# Patient Record
Sex: Male | Born: 1955 | Race: White | Hispanic: No | Marital: Married | Smoking: Never smoker
Health system: Southern US, Community
[De-identification: ages and names within clinical notes are randomized; demographics above are authoritative.]

## PROBLEM LIST (undated history)

## (undated) DIAGNOSIS — E785 Hyperlipidemia, unspecified: Secondary | ICD-10-CM

## (undated) DIAGNOSIS — T7840XA Allergy, unspecified, initial encounter: Secondary | ICD-10-CM

## (undated) DIAGNOSIS — K219 Gastro-esophageal reflux disease without esophagitis: Secondary | ICD-10-CM

## (undated) DIAGNOSIS — C61 Malignant neoplasm of prostate: Secondary | ICD-10-CM

## (undated) DIAGNOSIS — H269 Unspecified cataract: Secondary | ICD-10-CM

## (undated) DIAGNOSIS — I10 Essential (primary) hypertension: Secondary | ICD-10-CM

## (undated) DIAGNOSIS — C4491 Basal cell carcinoma of skin, unspecified: Secondary | ICD-10-CM

## (undated) HISTORY — PX: COLONOSCOPY: SHX174

## (undated) HISTORY — DX: Malignant neoplasm of prostate: C61

## (undated) HISTORY — DX: Essential (primary) hypertension: I10

## (undated) HISTORY — DX: Unspecified cataract: H26.9

## (undated) HISTORY — DX: Allergy, unspecified, initial encounter: T78.40XA

## (undated) HISTORY — DX: Hyperlipidemia, unspecified: E78.5

## (undated) HISTORY — DX: Basal cell carcinoma of skin, unspecified: C44.91

## (undated) HISTORY — DX: Gastro-esophageal reflux disease without esophagitis: K21.9

---

## 2016-04-03 DIAGNOSIS — Z0000 Encounter for general adult medical examination without abnormal findings: Secondary | ICD-10-CM | POA: Diagnosis not present

## 2016-09-18 DIAGNOSIS — L814 Other melanin hyperpigmentation: Secondary | ICD-10-CM | POA: Diagnosis not present

## 2016-09-18 DIAGNOSIS — D229 Melanocytic nevi, unspecified: Secondary | ICD-10-CM | POA: Diagnosis not present

## 2016-09-18 DIAGNOSIS — L821 Other seborrheic keratosis: Secondary | ICD-10-CM | POA: Diagnosis not present

## 2017-01-20 DIAGNOSIS — L820 Inflamed seborrheic keratosis: Secondary | ICD-10-CM | POA: Diagnosis not present

## 2017-01-20 DIAGNOSIS — D225 Melanocytic nevi of trunk: Secondary | ICD-10-CM | POA: Diagnosis not present

## 2017-01-20 DIAGNOSIS — L814 Other melanin hyperpigmentation: Secondary | ICD-10-CM | POA: Diagnosis not present

## 2017-01-20 DIAGNOSIS — L218 Other seborrheic dermatitis: Secondary | ICD-10-CM | POA: Diagnosis not present

## 2017-01-20 DIAGNOSIS — L570 Actinic keratosis: Secondary | ICD-10-CM | POA: Diagnosis not present

## 2017-04-08 ENCOUNTER — Ambulatory Visit (INDEPENDENT_AMBULATORY_CARE_PROVIDER_SITE_OTHER): Payer: BLUE CROSS/BLUE SHIELD | Admitting: Family Medicine

## 2017-04-08 ENCOUNTER — Encounter: Payer: Self-pay | Admitting: Family Medicine

## 2017-04-08 ENCOUNTER — Ambulatory Visit (INDEPENDENT_AMBULATORY_CARE_PROVIDER_SITE_OTHER): Payer: BLUE CROSS/BLUE SHIELD

## 2017-04-08 VITALS — BP 110/71 | HR 65 | Temp 98.7°F | Ht 70.0 in | Wt 189.0 lb

## 2017-04-08 DIAGNOSIS — Z0000 Encounter for general adult medical examination without abnormal findings: Secondary | ICD-10-CM | POA: Diagnosis not present

## 2017-04-08 DIAGNOSIS — Z125 Encounter for screening for malignant neoplasm of prostate: Secondary | ICD-10-CM

## 2017-04-08 DIAGNOSIS — Z23 Encounter for immunization: Secondary | ICD-10-CM

## 2017-04-08 DIAGNOSIS — M25562 Pain in left knee: Secondary | ICD-10-CM | POA: Diagnosis not present

## 2017-04-08 DIAGNOSIS — E782 Mixed hyperlipidemia: Secondary | ICD-10-CM | POA: Diagnosis not present

## 2017-04-08 DIAGNOSIS — E785 Hyperlipidemia, unspecified: Secondary | ICD-10-CM | POA: Insufficient documentation

## 2017-04-08 DIAGNOSIS — I10 Essential (primary) hypertension: Secondary | ICD-10-CM

## 2017-04-08 IMAGING — DX DG KNEE 1-2V*L*
2 series · 2 of 2 positions shown · non-contrast
Comparison: None

CLINICAL DATA: LEFT knee pain

EXAM:
LEFT KNEE - 1-2 VIEW

[knee ap]
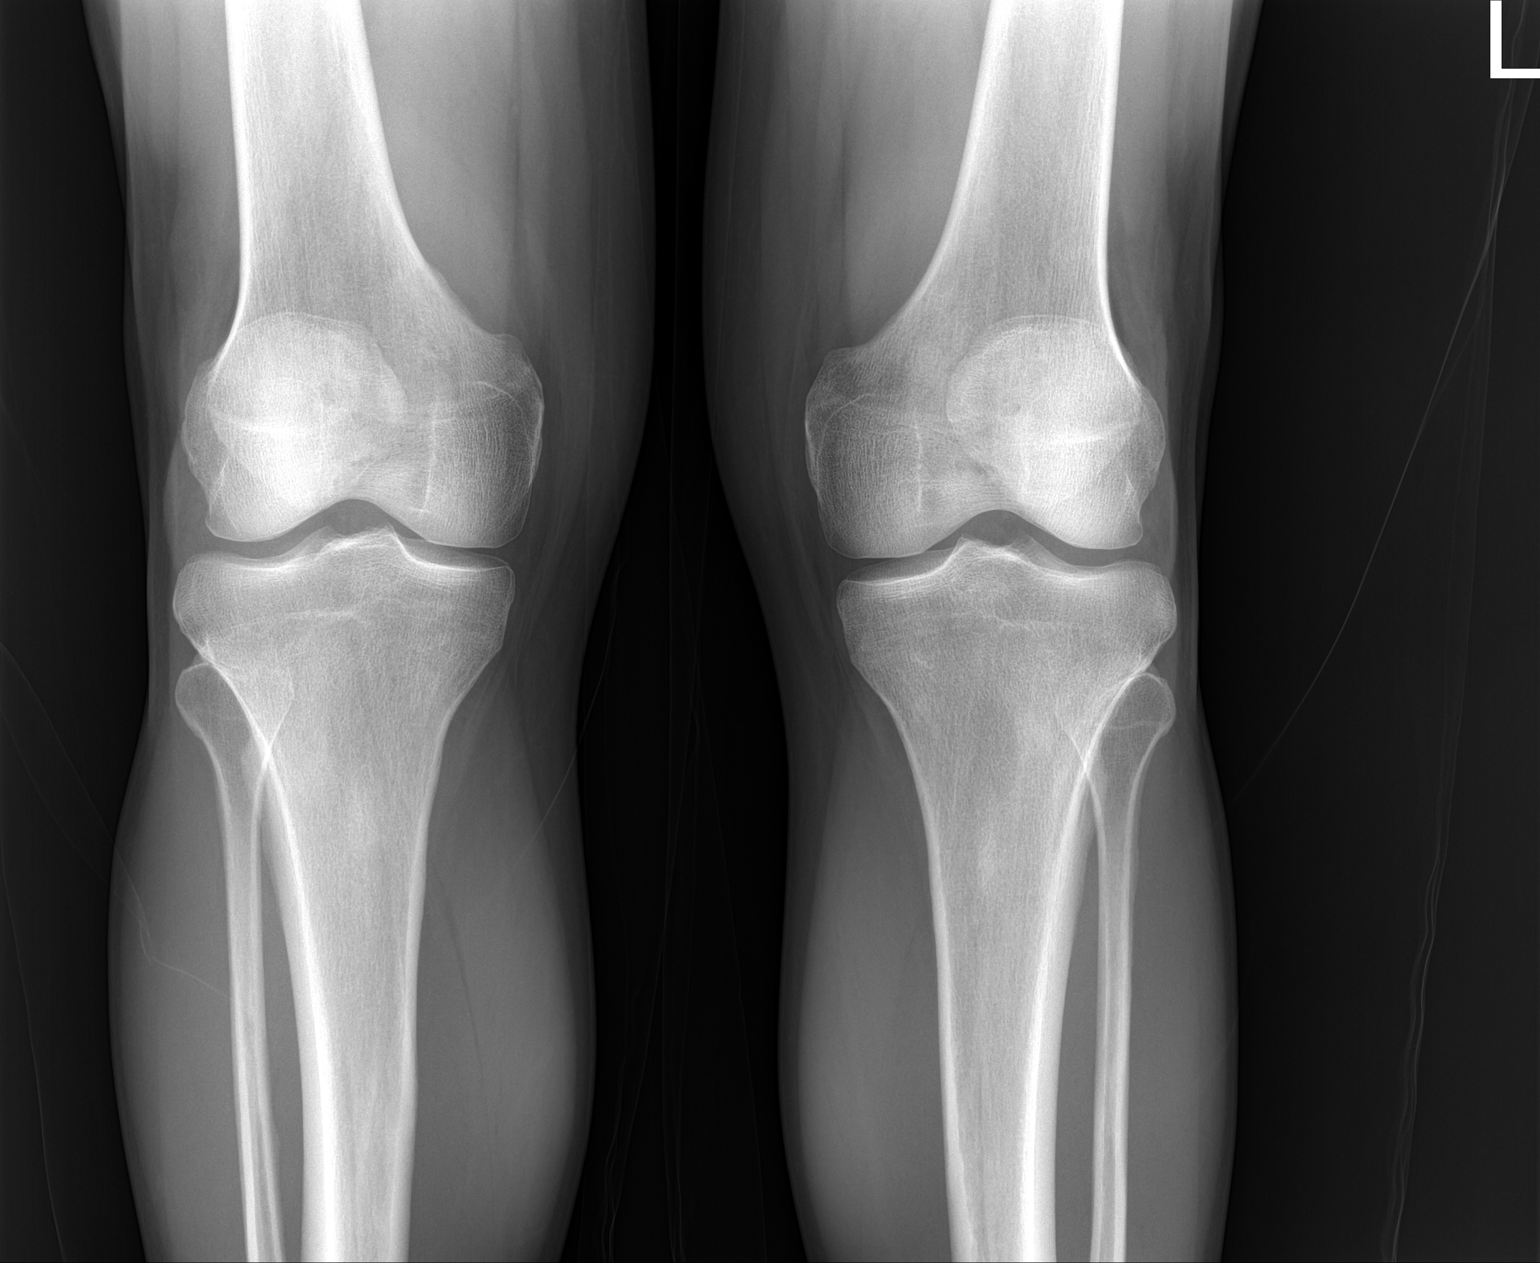

[knee lat]
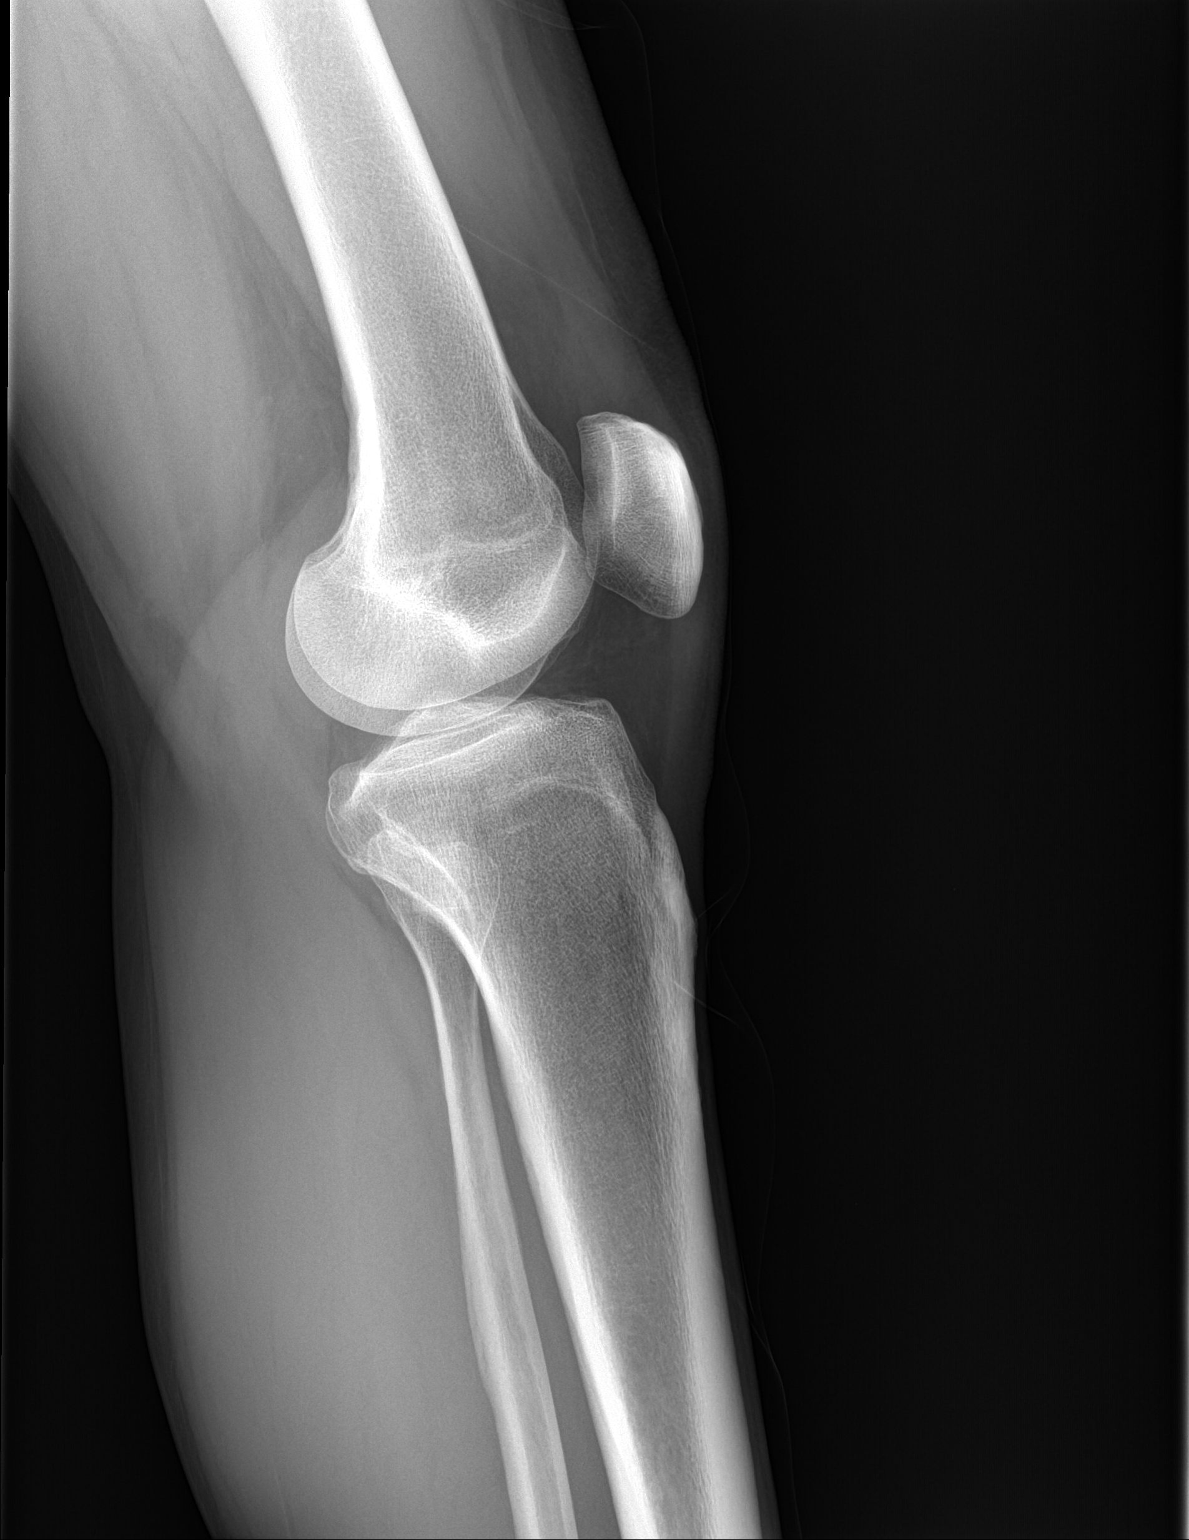

[2 of 2 positions shown; findings below may reference images not displayed]

FINDINGS: Osseous mineralization normal.

Joint spaces preserved.

No acute fracture, dislocation, or bone destruction.

No knee joint effusion.

Comparison AP view of the RIGHT knee is unremarkable.
IMPRESSION: Normal exam.

## 2017-04-08 NOTE — Progress Notes (Signed)
Subjective:  Patient ID: Edward Taylor, male    DOB: 1955/05/16  Age: 62 y.o. MRN: [id number]  CC: New Patient (Initial Visit) (pt here today to establish care and CPE)   HPI Edward Taylor presents for annual CPE.   What he is having pain in the left knee frequently.  Patient in for follow-up of elevated cholesterol. Doing well without complaints on current medication. Denies side effects of statin including myalgia and arthralgia and nausea. Also in today for liver function testing. Currently no chest pain, shortness of breath or other cardiovascular related symptoms noted.   follow-up of hypertension. Patient has no history of headache chest pain or shortness of breath or recent cough. Patient also denies symptoms of TIA such as numbness weakness lateralizing. Patient checks  blood pressure at home and has not had any elevated readings recently. Patient denies side effects from his medication. States taking it regularly.  Depression screen PHQ 2/9 04/08/2017  Decreased Interest 0  Down, Depressed, Hopeless 0  PHQ - 2 Score 0    History Kire has a past medical history of Allergy, GERD (gastroesophageal reflux disease), Hyperlipidemia, and Hypertension.   He has no past surgical history on file.   His family history includes Arthritis in his father; Cancer in his mother; Diabetes in his father; Heart disease in his maternal grandfather and paternal grandfather; Hyperlipidemia in his father; Hypertension in his father and mother; Stroke in his father.He reports that  has never smoked. he has never used smokeless tobacco. He reports that he drinks about 2.4 oz of alcohol per week. He reports that he does not use drugs.    ROS Review of Systems  Constitutional: Negative for chills, diaphoresis, fever and unexpected weight change.  HENT: Negative for congestion, hearing loss, rhinorrhea and sore throat.   Eyes: Negative for visual disturbance.  Respiratory: Negative for cough  and shortness of breath.   Cardiovascular: Negative for chest pain.  Gastrointestinal: Negative for abdominal pain, constipation and diarrhea.  Genitourinary: Negative for dysuria and flank pain.  Musculoskeletal: Negative for arthralgias and joint swelling.  Skin: Negative for rash.  Neurological: Negative for dizziness and headaches.  Psychiatric/Behavioral: Negative for dysphoric mood and sleep disturbance.    Objective:  BP 110/71   Pulse 65   Temp 98.7 F (37.1 C) (Oral)   Ht _0  (1.778 m)   Wt 189 lb (85.7 kg)   BMI 27.12 kg/m   BP Readings from Last 3 Encounters:  04/08/17 110/71    Wt Readings from Last 3 Encounters:  04/08/17 189 lb (85.7 kg)     Physical Exam  Constitutional: He is oriented to person, place, and time. He appears well-developed and well-nourished. No distress.  HENT:  Head: Normocephalic and atraumatic.  Right Ear: External ear normal.  Left Ear: External ear normal.  Nose: Nose normal.  Mouth/Throat: Oropharynx is clear and moist.  Eyes: Conjunctivae and EOM are normal. Pupils are equal, round, and reactive to light.  Neck: Normal range of motion. Neck supple. No thyromegaly present.  Cardiovascular: Normal rate, regular rhythm and normal heart sounds.  No murmur heard. Pulmonary/Chest: Effort normal and breath sounds normal. No respiratory distress. He has no wheezes. He has no rales.  Abdominal: Soft. Bowel sounds are normal. He exhibits no distension. There is no tenderness.  Lymphadenopathy:    He has no cervical adenopathy.  Neurological: He is alert and oriented to person, place, and time. He has normal reflexes.  Skin: Skin is warm  and dry.  Psychiatric: He has a normal mood and affect. His behavior is normal. Judgment and thought content normal.      Assessment & Plan:   Heith was seen today for new patient (initial visit).  Diagnoses and all orders for this visit:  Well adult exam -     CBC with  Differential/Platelet -     CMP14+EGFR -     Lipid panel -     PSA, total and free -     HIV antibody -     Hepatitis C antibody -     TSH -     VITAMIN D 25 Hydroxy (Vit-D Deficiency, Fractures) -     Urinalysis  Essential hypertension  Mixed hyperlipidemia -     Lipid panel  Special screening for malignant neoplasm of prostate -     PSA, total and free  Left knee pain, unspecified chronicity -     DG Knee 1-2 Views Left; Future  Other orders -     Tdap vaccine greater than or equal to 7yo IM   Knee XR no active disease noted   I am having [name] maintain his lisinopril, TOPROL XL, simvastatin, famotidine, fluticasone, and cetirizine.  Allergies as of 04/08/2017   No Known Allergies     Medication List        Accurate as of 04/08/17  9:18 PM. Always use your most recent med list.          cetirizine 10 MG tablet Commonly known as:  ZYRTEC Take 10 mg by mouth daily.   famotidine 40 MG tablet Commonly known as:  PEPCID Take 40 mg by mouth daily.   fluticasone 50 MCG/ACT nasal spray Commonly known as:  FLONASE Place into both nostrils daily.   lisinopril 20 MG tablet Commonly known as:  PRINIVIL,ZESTRIL   simvastatin 40 MG tablet Commonly known as:  ZOCOR   TOPROL XL 100 MG 24 hr tablet Generic drug:  metoprolol succinate        Follow-up: Return in about 6 months (around 10/09/2017), or if symptoms worsen or fail to improve.  Edward Taylor, M.D.

## 2017-04-10 ENCOUNTER — Other Ambulatory Visit: Payer: Self-pay | Admitting: *Deleted

## 2017-04-10 MED ORDER — VITAMIN D (ERGOCALCIFEROL) 1.25 MG (50000 UNIT) PO CAPS: 50000.0000 [IU] | ORAL_CAPSULE | ORAL | 1 refills | Status: DC

## 2017-04-11 ENCOUNTER — Encounter: Payer: Self-pay | Admitting: Family Medicine

## 2017-04-11 LAB — CBC WITH DIFFERENTIAL/PLATELET
BASOS: 0 %
Basophils Absolute: 0 10*3/uL (ref 0.0–0.2)
EOS (ABSOLUTE): 0.2 10*3/uL (ref 0.0–0.4)
EOS: 3 %
HEMATOCRIT: 45.9 % (ref 37.5–51.0)
HEMOGLOBIN: 15.8 g/dL (ref 13.0–17.7)
IMMATURE GRANULOCYTES: 1 %
Immature Grans (Abs): 0.1 10*3/uL (ref 0.0–0.1)
Lymphocytes Absolute: 1.9 10*3/uL (ref 0.7–3.1)
Lymphs: 22 %
MCH: 30.3 pg (ref 26.6–33.0)
MCHC: 34.4 g/dL (ref 31.5–35.7)
MCV: 88 fL (ref 79–97)
Monocytes Absolute: 0.8 10*3/uL (ref 0.1–0.9)
Monocytes: 9 %
NEUTROS ABS: 5.9 10*3/uL (ref 1.4–7.0)
NEUTROS PCT: 65 %
Platelets: 216 10*3/uL (ref 150–379)
RBC: 5.21 x10E6/uL (ref 4.14–5.80)
RDW: 13.6 % (ref 12.3–15.4)
WBC: 8.9 10*3/uL (ref 3.4–10.8)

## 2017-04-11 LAB — CMP14+EGFR
A/G RATIO: 2 (ref 1.2–2.2)
ALBUMIN: 5.2 g/dL (ref 3.5–5.5)
ALK PHOS: 45 IU/L (ref 39–117)
ALT: 27 IU/L (ref 0–44)
AST: 22 IU/L (ref 0–40)
BILIRUBIN TOTAL: 0.5 mg/dL (ref 0.0–1.2)
BUN / CREAT RATIO: 15 (ref 9–20)
BUN: 15 mg/dL (ref 6–24)
CHLORIDE: 99 mmol/L (ref 96–106)
CO2: 22 mmol/L (ref 20–29)
Calcium: 10 mg/dL (ref 8.7–10.2)
Creatinine, Ser: 0.98 mg/dL (ref 0.76–1.27)
GFR calc non Af Amer: 84 mL/min/{1.73_m2} (ref >59)
GFR, EST AFRICAN AMERICAN: 97 mL/min/{1.73_m2} (ref >59)
GLOBULIN, TOTAL: 2.6 g/dL (ref 1.5–4.5)
GLUCOSE: 90 mg/dL (ref 65–99)
POTASSIUM: 4.8 mmol/L (ref 3.5–5.2)
SODIUM: 143 mmol/L (ref 134–144)
Total Protein: 7.8 g/dL (ref 6.0–8.5)

## 2017-04-11 LAB — LIPID PANEL
CHOLESTEROL TOTAL: 147 mg/dL (ref 100–199)
Chol/HDL Ratio: 2.9 ratio (ref 0.0–5.0)
HDL: 51 mg/dL (ref >39)
LDL Calculated: 75 mg/dL (ref 0–99)
Triglycerides: 106 mg/dL (ref 0–149)
VLDL Cholesterol Cal: 21 mg/dL (ref 5–40)

## 2017-04-11 LAB — HEPATITIS C ANTIBODY

## 2017-04-11 LAB — VITAMIN D 25 HYDROXY (VIT D DEFICIENCY, FRACTURES): Vit D, 25-Hydroxy: 14.7 ng/mL — ABNORMAL LOW (ref 30.0–100.0)

## 2017-04-11 LAB — PSA, TOTAL AND FREE
PROSTATE SPECIFIC AG, SERUM: 4.1 ng/mL — AB (ref 0.0–4.0)
PSA FREE PCT: 9.5 %
PSA FREE: 0.39 ng/mL

## 2017-04-11 LAB — HIV ANTIBODY (ROUTINE TESTING W REFLEX): HIV SCREEN 4TH GENERATION: NONREACTIVE

## 2017-04-11 LAB — TSH: TSH: 4.32 u[IU]/mL (ref 0.450–4.500)

## 2017-04-16 ENCOUNTER — Encounter: Payer: Self-pay | Admitting: Family Medicine

## 2017-09-25 ENCOUNTER — Other Ambulatory Visit: Payer: Self-pay | Admitting: Family Medicine

## 2017-10-10 ENCOUNTER — Encounter: Payer: Self-pay | Admitting: Family Medicine

## 2017-10-10 ENCOUNTER — Ambulatory Visit (INDEPENDENT_AMBULATORY_CARE_PROVIDER_SITE_OTHER): Payer: BLUE CROSS/BLUE SHIELD | Admitting: Family Medicine

## 2017-10-10 VITALS — BP 110/72 | HR 67 | Temp 97.4°F | Ht 70.0 in | Wt 192.0 lb

## 2017-10-10 DIAGNOSIS — I10 Essential (primary) hypertension: Secondary | ICD-10-CM

## 2017-10-10 DIAGNOSIS — F418 Other specified anxiety disorders: Secondary | ICD-10-CM

## 2017-10-10 DIAGNOSIS — E782 Mixed hyperlipidemia: Secondary | ICD-10-CM

## 2017-10-10 DIAGNOSIS — Z129 Encounter for screening for malignant neoplasm, site unspecified: Secondary | ICD-10-CM | POA: Diagnosis not present

## 2017-10-10 MED ORDER — FLUTICASONE PROPIONATE 50 MCG/ACT NA SUSP: 1.0000 | Freq: Every day | NASAL | 1 refills | Status: DC

## 2017-10-10 MED ORDER — DULOXETINE HCL 30 MG PO CPEP
30.0000 mg | ORAL_CAPSULE | Freq: Every day | ORAL | 2 refills | Status: DC
Start: 2017-10-10 — End: 2017-11-03

## 2017-10-10 MED ORDER — LISINOPRIL 20 MG PO TABS: 20.0000 mg | ORAL_TABLET | Freq: Every day | ORAL | 1 refills | Status: DC

## 2017-10-10 MED ORDER — SIMVASTATIN 40 MG PO TABS: 40.0000 mg | ORAL_TABLET | Freq: Every day | ORAL | 1 refills | Status: DC

## 2017-10-10 MED ORDER — FAMOTIDINE 40 MG PO TABS: 40.0000 mg | ORAL_TABLET | Freq: Every day | ORAL | 1 refills | Status: DC

## 2017-10-10 MED ORDER — TOPROL XL 100 MG PO TB24: 100.0000 mg | ORAL_TABLET | Freq: Every day | ORAL | 1 refills | Status: DC

## 2017-10-10 NOTE — Progress Notes (Signed)
Subjective:  Patient ID: Edward Taylor, male    DOB: 07/26/1955  Age: 62 y.o. MRN: [id number]  CC: Medical Management of Chronic Issues and Referral to dermatology (would like to see Dr. Tarri Glenn in [place])   HPI Avedis Bevis presents for  follow-up of hypertension. Patient has no history of headache chest pain or shortness of breath or recent cough. Patient also denies symptoms of TIA such as focal numbness or weakness. Patient denies side effects from medication. States taking it regularly.   History Cederick has a past medical history of Allergy, GERD (gastroesophageal reflux disease), Hyperlipidemia, and Hypertension.   He has no past surgical history on file.   His family history includes Arthritis in his father; Cancer in his mother; Diabetes in his father; Heart disease in his maternal grandfather and paternal grandfather; Hyperlipidemia in his father; Hypertension in his father and mother; Stroke in his father.He reports that he has never smoked. He has never used smokeless tobacco. He reports that he drinks about 4.0 standard drinks of alcohol per week. He reports that he does not use drugs.  Current Outpatient Medications on File Prior to Visit  Medication Sig Dispense Refill  . cetirizine (ZYRTEC) 10 MG tablet Take 10 mg by mouth daily.    . Vitamin D, Ergocalciferol, (DRISDOL) 50000 units CAPS capsule Take 1 capsule (50,000 Units total) by mouth every 7 (seven) days. 13 capsule 1   No current facility-administered medications on file prior to visit.     ROS Review of Systems  Constitutional: Negative.   HENT: Negative.   Eyes: Negative for visual disturbance.  Respiratory: Negative for cough and shortness of breath.   Cardiovascular: Negative for chest pain and leg swelling.  Gastrointestinal: Negative for abdominal pain, diarrhea, nausea and vomiting.  Genitourinary: Negative for difficulty urinating.  Musculoskeletal: Negative for arthralgias and myalgias.  Skin:  Negative for rash.  Neurological: Negative for headaches.  Psychiatric/Behavioral: Positive for agitation (occurs in the morning driving to work. Tightens up from anxiety). Negative for sleep disturbance.    Objective:  BP 110/72   Pulse 67   Temp (!) 97.4 F (36.3 C) (Oral)   Ht 5' 10" (1.778 m)   Wt 192 lb (87.1 kg)   BMI 27.55 kg/m   BP Readings from Last 3 Encounters:  10/10/17 110/72  04/08/17 110/71    Wt Readings from Last 3 Encounters:  10/10/17 192 lb (87.1 kg)  04/08/17 189 lb (85.7 kg)     Physical Exam  Constitutional: He is oriented to person, place, and time. He appears well-developed and well-nourished. No distress.  HENT:  Head: Normocephalic and atraumatic.  Right Ear: External ear normal.  Left Ear: External ear normal.  Nose: Nose normal.  Mouth/Throat: Oropharynx is clear and moist.  Eyes: Pupils are equal, round, and reactive to light. Conjunctivae and EOM are normal.  Neck: Normal range of motion. Neck supple.  Cardiovascular: Normal rate, regular rhythm and normal heart sounds.  No murmur heard. Pulmonary/Chest: Effort normal and breath sounds normal. No respiratory distress. He has no wheezes. He has no rales.  Abdominal: Soft. There is no tenderness.  Musculoskeletal: Normal range of motion.  Neurological: He is alert and oriented to person, place, and time. He has normal reflexes.  Skin: Skin is warm and dry.  Psychiatric: He has a normal mood and affect. His behavior is normal. Judgment and thought content normal.      Assessment & Plan:   Tarence was seen today for medical  management of chronic issues and referral to dermatology.  Diagnoses and all orders for this visit:  Essential hypertension -     CMP14+EGFR -     TOPROL XL 100 MG 24 hr tablet; Take 1 tablet (100 mg total) by mouth daily. -     lisinopril (PRINIVIL,ZESTRIL) 20 MG tablet; Take 1 tablet (20 mg total) by mouth daily.  Mixed hyperlipidemia -     Lipid panel -      simvastatin (ZOCOR) 40 MG tablet; Take 1 tablet (40 mg total) by mouth daily at 6 PM.  Screening for cancer -     Ambulatory referral to Dermatology  Other specified anxiety disorders -     DULoxetine (CYMBALTA) 30 MG capsule; Take 1 capsule (30 mg total) by mouth daily.  Other orders -     famotidine (PEPCID) 40 MG tablet; Take 1 tablet (40 mg total) by mouth daily. -     fluticasone (FLONASE) 50 MCG/ACT nasal spray; Place 1 spray into both nostrils daily.   Allergies as of 10/10/2017   No Known Allergies     Medication List        Accurate as of 10/10/17 11:53 AM. Always use your most recent med list.          cetirizine 10 MG tablet Commonly known as:  ZYRTEC Take 10 mg by mouth daily.   DULoxetine 30 MG capsule Commonly known as:  CYMBALTA Take 1 capsule (30 mg total) by mouth daily.   famotidine 40 MG tablet Commonly known as:  PEPCID Take 1 tablet (40 mg total) by mouth daily.   fluticasone 50 MCG/ACT nasal spray Commonly known as:  FLONASE Place 1 spray into both nostrils daily.   lisinopril 20 MG tablet Commonly known as:  PRINIVIL,ZESTRIL Take 1 tablet (20 mg total) by mouth daily.   simvastatin 40 MG tablet Commonly known as:  ZOCOR Take 1 tablet (40 mg total) by mouth daily at 6 PM.   TOPROL XL 100 MG 24 hr tablet Generic drug:  metoprolol succinate Take 1 tablet (100 mg total) by mouth daily.   Vitamin D (Ergocalciferol) 50000 units Caps capsule Commonly known as:  DRISDOL Take 1 capsule (50,000 Units total) by mouth every 7 (seven) days.       Meds ordered this encounter  Medications  . DULoxetine (CYMBALTA) 30 MG capsule    Sig: Take 1 capsule (30 mg total) by mouth daily.    Dispense:  30 capsule    Refill:  2  . TOPROL XL 100 MG 24 hr tablet    Sig: Take 1 tablet (100 mg total) by mouth daily.    Dispense:  90 tablet    Refill:  1  . simvastatin (ZOCOR) 40 MG tablet    Sig: Take 1 tablet (40 mg total) by mouth daily at 6 PM.     Dispense:  90 tablet    Refill:  1  . lisinopril (PRINIVIL,ZESTRIL) 20 MG tablet    Sig: Take 1 tablet (20 mg total) by mouth daily.    Dispense:  90 tablet    Refill:  1  . famotidine (PEPCID) 40 MG tablet    Sig: Take 1 tablet (40 mg total) by mouth daily.    Dispense:  90 tablet    Refill:  1  . fluticasone (FLONASE) 50 MCG/ACT nasal spray    Sig: Place 1 spray into both nostrils daily.    Dispense:  150 g    Refill:  1     Follow-up: Return in about 6 months (around 04/10/2018).  Claretta Fraise, M.D.

## 2017-10-11 LAB — CMP14+EGFR
A/G RATIO: 1.9 (ref 1.2–2.2)
ALT: 29 IU/L (ref 0–44)
AST: 24 IU/L (ref 0–40)
Albumin: 5 g/dL (ref 3.5–5.5)
Alkaline Phosphatase: 44 IU/L (ref 39–117)
BUN / CREAT RATIO: 15 (ref 9–20)
BUN: 17 mg/dL (ref 6–24)
Bilirubin Total: 0.6 mg/dL (ref 0.0–1.2)
CALCIUM: 9.4 mg/dL (ref 8.7–10.2)
CO2: 19 mmol/L — ABNORMAL LOW (ref 20–29)
Chloride: 100 mmol/L (ref 96–106)
Creatinine, Ser: 1.1 mg/dL (ref 0.76–1.27)
GFR, EST AFRICAN AMERICAN: 84 mL/min/{1.73_m2} (ref >59)
GFR, EST NON AFRICAN AMERICAN: 73 mL/min/{1.73_m2} (ref >59)
GLOBULIN, TOTAL: 2.7 g/dL (ref 1.5–4.5)
Glucose: 90 mg/dL (ref 65–99)
POTASSIUM: 4.9 mmol/L (ref 3.5–5.2)
SODIUM: 138 mmol/L (ref 134–144)
TOTAL PROTEIN: 7.7 g/dL (ref 6.0–8.5)

## 2017-10-11 LAB — LIPID PANEL
CHOL/HDL RATIO: 3.3 ratio (ref 0.0–5.0)
Cholesterol, Total: 140 mg/dL (ref 100–199)
HDL: 42 mg/dL (ref >39)
LDL Calculated: 79 mg/dL (ref 0–99)
Triglycerides: 94 mg/dL (ref 0–149)
VLDL Cholesterol Cal: 19 mg/dL (ref 5–40)

## 2017-10-21 ENCOUNTER — Telehealth: Payer: Self-pay | Admitting: *Deleted

## 2017-10-21 NOTE — Telephone Encounter (Signed)
VM received Brand Toprol cost is $166 Generic Metoprolol is $99.66 Please advise if calling back use reference #[id number]

## 2017-10-21 NOTE — Telephone Encounter (Signed)
Which one does he want? Send in okay for whichever pt prefers. Thanks, WS

## 2017-10-22 ENCOUNTER — Other Ambulatory Visit: Payer: Self-pay | Admitting: Family Medicine

## 2017-10-22 DIAGNOSIS — I10 Essential (primary) hypertension: Secondary | ICD-10-CM

## 2017-10-22 MED ORDER — METOPROLOL SUCCINATE ER 100 MG PO TB24: 100.0000 mg | ORAL_TABLET | Freq: Every day | ORAL | 1 refills | Status: DC

## 2017-10-22 NOTE — Telephone Encounter (Signed)
Pt would like the generic Metoprolol sent in

## 2017-10-22 NOTE — Telephone Encounter (Signed)
I sent in the requested prescription 

## 2017-10-30 DIAGNOSIS — L821 Other seborrheic keratosis: Secondary | ICD-10-CM | POA: Diagnosis not present

## 2017-10-30 DIAGNOSIS — Z1283 Encounter for screening for malignant neoplasm of skin: Secondary | ICD-10-CM | POA: Diagnosis not present

## 2017-11-03 ENCOUNTER — Other Ambulatory Visit: Payer: Self-pay | Admitting: Family Medicine

## 2017-11-03 DIAGNOSIS — F418 Other specified anxiety disorders: Secondary | ICD-10-CM

## 2017-12-29 ENCOUNTER — Other Ambulatory Visit: Payer: Self-pay | Admitting: *Deleted

## 2017-12-29 DIAGNOSIS — I10 Essential (primary) hypertension: Secondary | ICD-10-CM

## 2017-12-29 MED ORDER — METOPROLOL SUCCINATE ER 100 MG PO TB24: 100.0000 mg | ORAL_TABLET | Freq: Every day | ORAL | 1 refills | Status: DC

## 2018-01-07 ENCOUNTER — Encounter: Payer: Self-pay | Admitting: Family Medicine

## 2018-03-28 ENCOUNTER — Other Ambulatory Visit: Payer: Self-pay | Admitting: Family Medicine

## 2018-03-28 DIAGNOSIS — I10 Essential (primary) hypertension: Secondary | ICD-10-CM

## 2018-03-28 DIAGNOSIS — E782 Mixed hyperlipidemia: Secondary | ICD-10-CM

## 2018-04-10 ENCOUNTER — Encounter: Payer: Self-pay | Admitting: Family Medicine

## 2018-04-10 ENCOUNTER — Ambulatory Visit (INDEPENDENT_AMBULATORY_CARE_PROVIDER_SITE_OTHER): Payer: BLUE CROSS/BLUE SHIELD | Admitting: Family Medicine

## 2018-04-10 VITALS — BP 110/66 | HR 66 | Temp 98.9°F | Ht 70.0 in | Wt 197.1 lb

## 2018-04-10 DIAGNOSIS — Z125 Encounter for screening for malignant neoplasm of prostate: Secondary | ICD-10-CM | POA: Diagnosis not present

## 2018-04-10 DIAGNOSIS — I10 Essential (primary) hypertension: Secondary | ICD-10-CM | POA: Diagnosis not present

## 2018-04-10 DIAGNOSIS — R635 Abnormal weight gain: Secondary | ICD-10-CM | POA: Diagnosis not present

## 2018-04-10 DIAGNOSIS — E782 Mixed hyperlipidemia: Secondary | ICD-10-CM | POA: Diagnosis not present

## 2018-04-10 MED ORDER — LISINOPRIL 20 MG PO TABS: 20.0000 mg | ORAL_TABLET | Freq: Every day | ORAL | 1 refills | Status: DC

## 2018-04-10 MED ORDER — METOPROLOL SUCCINATE ER 100 MG PO TB24: 100.0000 mg | ORAL_TABLET | Freq: Every day | ORAL | 1 refills | Status: DC

## 2018-04-10 NOTE — Progress Notes (Signed)
Subjective:  Patient ID: Edward Taylor, male    DOB: 03-19-1955  Age: 63 y.o. MRN: [id number]  CC: Medical Management of Chronic Issues   HPI Louden Houseworth presents for  Follow-up of hypertension. Patient has no history of headache chest pain or shortness of breath or recent cough. Patient also denies symptoms of TIA such as numbness weakness lateralizing. Patient checks  blood pressure at home and has not had any elevated readings recently. Patient denies side effects from his medication. States taking it regularly. Patient in for follow-up of elevated cholesterol. Doing well without complaints on current medication. Denies side effects of statin including myalgia and arthralgia and nausea. Also in today for liver function testing. Currently no chest pain, shortness of breath or other cardiovascular related symptoms noted.  Depression screen [facility] 2/9 04/10/2018 10/10/2017 04/08/2017  Decreased Interest 0 0 0  Down, Depressed, Hopeless 0 0 0  PHQ - 2 Score 0 0 0    History Hilman has a past medical history of Allergy, GERD (gastroesophageal reflux disease), Hyperlipidemia, and Hypertension.   He has no past surgical history on file.   His family history includes Arthritis in his father; Cancer in his mother; Diabetes in his father; Heart disease in his maternal grandfather and paternal grandfather; Hyperlipidemia in his father; Hypertension in his father and mother; Stroke in his father.He reports that he has never smoked. He has never used smokeless tobacco. He reports current alcohol use of about 4.0 standard drinks of alcohol per week. He reports that he does not use drugs.    ROS Review of Systems  Constitutional: Negative.   HENT: Negative.   Eyes: Negative for visual disturbance.  Respiratory: Negative for cough and shortness of breath.   Cardiovascular: Negative for chest pain and leg swelling.  Gastrointestinal: Negative for abdominal pain, diarrhea, nausea and vomiting.    Genitourinary: Negative for difficulty urinating.  Musculoskeletal: Negative for arthralgias and myalgias.  Skin: Negative for rash.  Neurological: Negative for headaches.  Psychiatric/Behavioral: Negative for sleep disturbance.    Objective:  BP 110/66   Pulse 66   Temp 98.9 F (37.2 C) (Oral)   Ht '5\' 10"'$  (1.778 m)   Wt 197 lb 2 oz (89.4 kg)   BMI 28.28 kg/m   BP Readings from Last 3 Encounters:  04/10/18 110/66  10/10/17 110/72  04/08/17 110/71    Wt Readings from Last 3 Encounters:  04/10/18 197 lb 2 oz (89.4 kg)  10/10/17 192 lb (87.1 kg)  04/08/17 189 lb (85.7 kg)     Physical Exam Vitals signs reviewed.  Constitutional:      Appearance: He is well-developed.  HENT:     Head: Normocephalic and atraumatic.     Right Ear: Tympanic membrane and external ear normal. No decreased hearing noted.     Left Ear: Tympanic membrane and external ear normal. No decreased hearing noted.     Mouth/Throat:     Pharynx: No oropharyngeal exudate or posterior oropharyngeal erythema.  Eyes:     Pupils: Pupils are equal, round, and reactive to light.  Neck:     Musculoskeletal: Normal range of motion and neck supple.  Cardiovascular:     Rate and Rhythm: Normal rate and regular rhythm.     Heart sounds: No murmur.  Pulmonary:     Effort: No respiratory distress.     Breath sounds: Normal breath sounds.  Abdominal:     General: Bowel sounds are normal.     Palpations: Abdomen  is soft. There is no mass.     Tenderness: There is no abdominal tenderness.       Assessment & Plan:   Elihue was seen today for medical management of chronic issues.  Diagnoses and all orders for this visit:  Weight gain -     TSH  Mixed hyperlipidemia -     CBC with Differential/Platelet -     CMP14+EGFR -     Lipid panel  Special screening for malignant neoplasm of prostate -     PSA, total and free  Essential hypertension -     lisinopril (PRINIVIL,ZESTRIL) 20 MG tablet; Take 1  tablet (20 mg total) by mouth daily. -     metoprolol succinate (TOPROL XL) 100 MG 24 hr tablet; Take 1 tablet (100 mg total) by mouth daily.       I have discontinued [name]'s DULoxetine. I have also changed his lisinopril. Additionally, I am having him maintain his cetirizine, Vitamin D (Ergocalciferol), fluticasone, famotidine, simvastatin, and metoprolol succinate.  Allergies as of 04/10/2018   No Known Allergies     Medication List       Accurate as of April 10, 2018 11:21 AM. Always use your most recent med list.        cetirizine 10 MG tablet Commonly known as:  ZYRTEC Take 10 mg by mouth daily.   famotidine 40 MG tablet Commonly known as:  PEPCID TAKE 1 TABLET DAILY   fluticasone 50 MCG/ACT nasal spray Commonly known as:  FLONASE Place 1 spray into both nostrils daily.   lisinopril 20 MG tablet Commonly known as:  PRINIVIL,ZESTRIL Take 1 tablet (20 mg total) by mouth daily.   metoprolol succinate 100 MG 24 hr tablet Commonly known as:  Toprol XL Take 1 tablet (100 mg total) by mouth daily.   simvastatin 40 MG tablet Commonly known as:  ZOCOR TAKE 1 TABLET DAILY AT 6PM   Vitamin D (Ergocalciferol) 1.25 MG (50000 UT) Caps capsule Commonly known as:  DRISDOL Take 1 capsule (50,000 Units total) by mouth every 7 (seven) days.      Weight loss encouraged, strategies reviewed.  Follow-up: Return in about 6 months (around 10/11/2018).  Claretta Fraise, M.D.

## 2018-04-11 LAB — LIPID PANEL
CHOL/HDL RATIO: 3.8 ratio (ref 0.0–5.0)
CHOLESTEROL TOTAL: 132 mg/dL (ref 100–199)
HDL: 35 mg/dL — ABNORMAL LOW (ref >39)
LDL CALC: 74 mg/dL (ref 0–99)
Triglycerides: 117 mg/dL (ref 0–149)
VLDL Cholesterol Cal: 23 mg/dL (ref 5–40)

## 2018-04-11 LAB — CMP14+EGFR
ALBUMIN: 5.1 g/dL — AB (ref 3.8–4.9)
ALT: 32 IU/L (ref 0–44)
AST: 22 IU/L (ref 0–40)
Albumin/Globulin Ratio: 1.9 (ref 1.2–2.2)
Alkaline Phosphatase: 52 IU/L (ref 39–117)
BUN/Creatinine Ratio: 17 (ref 10–24)
BUN: 18 mg/dL (ref 8–27)
Bilirubin Total: 0.7 mg/dL (ref 0.0–1.2)
CALCIUM: 9.6 mg/dL (ref 8.6–10.2)
CO2: 18 mmol/L — AB (ref 20–29)
CREATININE: 1.03 mg/dL (ref 0.76–1.27)
Chloride: 98 mmol/L (ref 96–106)
GFR calc Af Amer: 91 mL/min/{1.73_m2} (ref >59)
GFR, EST NON AFRICAN AMERICAN: 79 mL/min/{1.73_m2} (ref >59)
GLUCOSE: 104 mg/dL — AB (ref 65–99)
Globulin, Total: 2.7 g/dL (ref 1.5–4.5)
POTASSIUM: 5 mmol/L (ref 3.5–5.2)
SODIUM: 138 mmol/L (ref 134–144)
TOTAL PROTEIN: 7.8 g/dL (ref 6.0–8.5)

## 2018-04-11 LAB — CBC WITH DIFFERENTIAL/PLATELET
BASOS ABS: 0.1 10*3/uL (ref 0.0–0.2)
Basos: 1 %
EOS (ABSOLUTE): 0.1 10*3/uL (ref 0.0–0.4)
Eos: 2 %
HEMOGLOBIN: 16.7 g/dL (ref 13.0–17.7)
Hematocrit: 47.5 % (ref 37.5–51.0)
IMMATURE GRANS (ABS): 0.1 10*3/uL (ref 0.0–0.1)
Immature Granulocytes: 1 %
LYMPHS: 24 %
Lymphocytes Absolute: 1.7 10*3/uL (ref 0.7–3.1)
MCH: 30.9 pg (ref 26.6–33.0)
MCHC: 35.2 g/dL (ref 31.5–35.7)
MCV: 88 fL (ref 79–97)
MONOCYTES: 8 %
Monocytes Absolute: 0.6 10*3/uL (ref 0.1–0.9)
Neutrophils Absolute: 4.6 10*3/uL (ref 1.4–7.0)
Neutrophils: 64 %
Platelets: 250 10*3/uL (ref 150–450)
RBC: 5.41 x10E6/uL (ref 4.14–5.80)
RDW: 12.9 % (ref 11.6–15.4)
WBC: 7.1 10*3/uL (ref 3.4–10.8)

## 2018-04-11 LAB — PSA, TOTAL AND FREE
PROSTATE SPECIFIC AG, SERUM: 4.3 ng/mL — AB (ref 0.0–4.0)
PSA, Free Pct: 8.1 %
PSA, Free: 0.35 ng/mL

## 2018-04-11 LAB — TSH: TSH: 2.59 u[IU]/mL (ref 0.450–4.500)

## 2018-04-13 NOTE — Progress Notes (Signed)
Hello Edward Taylor,  Your lab result is normal.Some minor variations that are not significant are commonly marked abnormal, but do not represent any medical problem for you.  Best regards, Cristobal Advani, M.D.

## 2018-06-26 ENCOUNTER — Other Ambulatory Visit: Payer: Self-pay | Admitting: Family Medicine

## 2018-06-26 DIAGNOSIS — E782 Mixed hyperlipidemia: Secondary | ICD-10-CM

## 2018-08-13 ENCOUNTER — Other Ambulatory Visit: Payer: Self-pay

## 2018-08-14 ENCOUNTER — Encounter: Payer: Self-pay | Admitting: Family Medicine

## 2018-08-14 ENCOUNTER — Ambulatory Visit (INDEPENDENT_AMBULATORY_CARE_PROVIDER_SITE_OTHER): Payer: BC Managed Care – PPO | Admitting: Family Medicine

## 2018-08-14 VITALS — BP 117/73 | HR 69 | Temp 98.0°F | Ht 70.0 in | Wt 199.2 lb

## 2018-08-14 DIAGNOSIS — S90922A Unspecified superficial injury of left foot, initial encounter: Secondary | ICD-10-CM

## 2018-08-14 DIAGNOSIS — T148XXA Other injury of unspecified body region, initial encounter: Secondary | ICD-10-CM

## 2018-08-14 NOTE — Progress Notes (Signed)
Subjective:   Patient ID: Edward Taylor, male    DOB: 09-27-1955, 63 y.o.   MRN: [id number]  HPI: Edward Taylor is a 63 y.o. male presenting on 08/14/2018 for Foot Pain (Left - x 2 weeks ago after twisting foot)  Patient reports at least two weeks ago he twisted his left foot. It states it was not a big deal, as he doesn't even recall what he twisted it doing. He has continued intermittently to have slight pain at the top of the left great toe that sometimes radiates to the bottom of his foot, which he rates 2-5/10. Pain occurs most often when he is walking. He is exercising daily (walking on the treadmill) but stopped for the past week as he was afraid it was making it worse. He did have a small amount of swelling initially that has resolved. There has been no bruising. His wife asked that he come have his foot assessed.   ROS: Negative unless specifically indicated above in HPI.   Allergies as of 08/14/2018   No Known Allergies     Medication List       Accurate as of August 14, 2018  4:22 PM. If you have any questions, ask your nurse or doctor.        cetirizine 10 MG tablet Commonly known as: ZYRTEC Take 10 mg by mouth daily.   famotidine 40 MG tablet Commonly known as: PEPCID TAKE 1 TABLET DAILY   fluticasone 50 MCG/ACT nasal spray Commonly known as: FLONASE Place 1 spray into both nostrils daily.   lisinopril 20 MG tablet Commonly known as: ZESTRIL Take 1 tablet (20 mg total) by mouth daily.   metoprolol succinate 100 MG 24 hr tablet Commonly known as: Toprol XL Take 1 tablet (100 mg total) by mouth daily.   simvastatin 40 MG tablet Commonly known as: ZOCOR TAKE 1 TABLET DAILY AT 6PM   Vitamin D (Ergocalciferol) 1.25 MG (50000 UT) Caps capsule Commonly known as: DRISDOL Take 1 capsule (50,000 Units total) by mouth every 7 (seven) days.       Objective:   BP 117/73   Pulse 69   Temp 98 F (36.7 C) (Oral)   Ht 5\' 10"  (1.778 m)   Wt 199 lb 3.2 oz (90.4  kg)   BMI 28.58 kg/m    Physical Exam Vitals signs reviewed.  Constitutional:      General: He is not in acute distress.    Appearance: Normal appearance. He is not ill-appearing, toxic-appearing or diaphoretic.  HENT:     Head: Normocephalic and atraumatic.  Eyes:     General: No scleral icterus.       Right eye: No discharge.        Left eye: No discharge.     Conjunctiva/sclera: Conjunctivae normal.  Neck:     Musculoskeletal: Normal range of motion.  Cardiovascular:     Rate and Rhythm: Normal rate.     Pulses: Normal pulses.  Pulmonary:     Effort: Pulmonary effort is normal. No respiratory distress.  Musculoskeletal: Normal range of motion.     Comments: Slight pain when palpating left great toe and rotating it in circular motions. No swelling, erythema, or bruising.   Skin:    General: Skin is warm and dry.  Neurological:     Mental Status: He is alert and oriented to person, place, and time.     Gait: Gait normal.  Psychiatric:        Mood and  Affect: Mood normal.        Behavior: Behavior normal.        Thought Content: Thought content normal.        Judgment: Judgment normal.     Assessment & Plan:   1. Muscle strain - Education provided on muscle strains. Encouraged him to rest the foot to allow for healing. NSAIDs PRN.   Follow up plan: Return if symptoms worsen or fail to improve.  Hendricks Limes, MSN, APRN, FNP-C [place]

## 2018-08-14 NOTE — Patient Instructions (Signed)
Muscle Strain A muscle strain is an injury that happens when a muscle is stretched longer than normal. This can happen during a fall, sports, or lifting. This can tear some muscle fibers. Usually, recovery from muscle strain takes 1-2 weeks. Complete healing normally takes 5-6 weeks. This condition is first treated with PRICE therapy. This involves:  Protecting your muscle from being injured again.  Resting your injured muscle.  Icing your injured muscle.  Applying pressure (compression) to your injured muscle. This may be done with a splint or elastic bandage.  Raising (elevating) your injured muscle. Your doctor may also recommend medicine for pain. Follow these instructions at home: If you have a splint:  Wear the splint as told by your doctor. Take it off only as told by your doctor.  Loosen the splint if your fingers or toes tingle, get numb, or turn cold and blue.  Keep the splint clean.  If the splint is not waterproof: ? Do not let it get wet. ? Cover it with a watertight covering when you take a bath or a shower. Managing pain, stiffness, and swelling   If directed, put ice on your injured area. ? If you have a removable splint, take it off as told by your doctor. ? Put ice in a plastic bag. ? Place a towel between your skin and the bag. ? Leave the ice on for 20 minutes, 2-3 times a day.  Move your fingers or toes often. This helps to avoid stiffness and lessen swelling.  Raise your injured area above the level of your heart while you are sitting or lying down.  Wear an elastic bandage as told by your doctor. Make sure it is not too tight. General instructions  Take over-the-counter and prescription medicines only as told by your doctor.  Limit your activity. Rest your injured muscle as told by your doctor. Your doctor may say that gentle movements are okay.  If physical therapy was prescribed, do exercises as told by your doctor.  Do not put pressure on any  part of the splint until it is fully hardened. This may take many hours.  Do not use any products that contain nicotine or tobacco, such as cigarettes and e-cigarettes. These can delay bone healing. If you need help quitting, ask your doctor.  Warm up before you exercise. This helps to prevent more muscle strains.  Ask your doctor when it is safe to drive if you have a splint.  Keep all follow-up visits as told by your doctor. This is important. Contact a doctor if:  You have more pain or swelling in your injured area. Get help right away if:  You have any of these problems in your injured area: ? You have numbness. ? You have tingling. ? You lose a lot of strength. Summary  A muscle strain is an injury that happens when a muscle is stretched longer than normal.  This condition is first treated with PRICE therapy. This includes protecting, resting, icing, adding pressure, and raising your injury.  Limit your activity. Rest your injured muscle as told by your doctor. Your doctor may say that gentle movements are okay.  Warm up before you exercise. This helps to prevent more muscle strains. This information is not intended to replace advice given to you by your health care provider. Make sure you discuss any questions you have with your health care provider. Document Released: 10/31/2007 Document Revised: 03/19/2018 Document Reviewed: 02/28/2016 Elsevier Patient Education  2020 [employer] Inc[employer]  

## 2018-09-24 ENCOUNTER — Other Ambulatory Visit: Payer: Self-pay | Admitting: Family Medicine

## 2018-09-24 DIAGNOSIS — E782 Mixed hyperlipidemia: Secondary | ICD-10-CM

## 2018-10-14 ENCOUNTER — Encounter: Payer: BC Managed Care – PPO | Admitting: Family Medicine

## 2018-11-02 DIAGNOSIS — D225 Melanocytic nevi of trunk: Secondary | ICD-10-CM | POA: Diagnosis not present

## 2018-11-02 DIAGNOSIS — X32XXXD Exposure to sunlight, subsequent encounter: Secondary | ICD-10-CM | POA: Diagnosis not present

## 2018-11-02 DIAGNOSIS — Z08 Encounter for follow-up examination after completed treatment for malignant neoplasm: Secondary | ICD-10-CM | POA: Diagnosis not present

## 2018-11-02 DIAGNOSIS — Z85828 Personal history of other malignant neoplasm of skin: Secondary | ICD-10-CM | POA: Diagnosis not present

## 2018-11-02 DIAGNOSIS — L570 Actinic keratosis: Secondary | ICD-10-CM | POA: Diagnosis not present

## 2018-11-02 DIAGNOSIS — Z1283 Encounter for screening for malignant neoplasm of skin: Secondary | ICD-10-CM | POA: Diagnosis not present

## 2018-11-04 ENCOUNTER — Other Ambulatory Visit: Payer: Self-pay

## 2018-11-05 ENCOUNTER — Encounter: Payer: Self-pay | Admitting: Family Medicine

## 2018-11-05 ENCOUNTER — Ambulatory Visit (INDEPENDENT_AMBULATORY_CARE_PROVIDER_SITE_OTHER): Payer: BC Managed Care – PPO | Admitting: Family Medicine

## 2018-11-05 ENCOUNTER — Other Ambulatory Visit: Payer: Self-pay

## 2018-11-05 VITALS — BP 102/69 | HR 68 | Temp 97.3°F | Ht 70.0 in | Wt 198.0 lb

## 2018-11-05 DIAGNOSIS — Z125 Encounter for screening for malignant neoplasm of prostate: Secondary | ICD-10-CM

## 2018-11-05 DIAGNOSIS — E559 Vitamin D deficiency, unspecified: Secondary | ICD-10-CM

## 2018-11-05 DIAGNOSIS — I10 Essential (primary) hypertension: Secondary | ICD-10-CM

## 2018-11-05 DIAGNOSIS — Z0001 Encounter for general adult medical examination with abnormal findings: Secondary | ICD-10-CM | POA: Diagnosis not present

## 2018-11-05 DIAGNOSIS — E782 Mixed hyperlipidemia: Secondary | ICD-10-CM

## 2018-11-05 DIAGNOSIS — Z0000 Encounter for general adult medical examination without abnormal findings: Secondary | ICD-10-CM | POA: Diagnosis not present

## 2018-11-05 MED ORDER — SILDENAFIL CITRATE 20 MG PO TABS: ORAL_TABLET | ORAL | 3 refills | Status: DC

## 2018-11-05 MED ORDER — METOPROLOL SUCCINATE ER 100 MG PO TB24: 100.0000 mg | ORAL_TABLET | Freq: Every day | ORAL | 1 refills | Status: DC

## 2018-11-05 MED ORDER — SIMVASTATIN 40 MG PO TABS: 40.0000 mg | ORAL_TABLET | Freq: Every day | ORAL | 3 refills | Status: DC

## 2018-11-05 MED ORDER — FAMOTIDINE 40 MG PO TABS: 40.0000 mg | ORAL_TABLET | Freq: Every day | ORAL | 3 refills | Status: DC

## 2018-11-05 MED ORDER — LISINOPRIL 20 MG PO TABS: 20.0000 mg | ORAL_TABLET | Freq: Every day | ORAL | 3 refills | Status: DC

## 2018-11-05 NOTE — Progress Notes (Signed)
Subjective:  Patient ID: Edward Taylor, male    DOB: 02-01-1956  Age: 63 y.o. MRN: [id number]  CC: Annual Exam   HPI Xaiver Roskelley presents for CPE   presents for  follow-up of hypertension. Patient has no history of headache chest pain or shortness of breath or recent cough. Patient also denies symptoms of TIA such as focal numbness or weakness. Patient denies side effects from medication. States taking it regularly.  in for follow-up of elevated cholesterol. Doing well without complaints on current medication. Denies side effects of statin including myalgia and arthralgia and nausea. Currently no chest pain, shortness of breath or other cardiovascular related symptoms noted.   Depression screen [facility] 2/9 11/05/2018 11/05/2018 08/14/2018  Decreased Interest 0 0 0  Down, Depressed, Hopeless 0 0 0  PHQ - 2 Score 0 0 0  Altered sleeping 0 - -  Tired, decreased energy 0 - -  Change in appetite 0 - -  Feeling bad or failure about yourself  0 - -  Trouble concentrating 0 - -  Moving slowly or fidgety/restless 0 - -  Suicidal thoughts 0 - -  PHQ-9 Score 0 - -    History Nicholous has a past medical history of Allergy, GERD (gastroesophageal reflux disease), Hyperlipidemia, and Hypertension.   He has no past surgical history on file.   His family history includes Arthritis in his father; Cancer in his mother; Diabetes in his father; Heart disease in his maternal grandfather and paternal grandfather; Hyperlipidemia in his father; Hypertension in his father and mother; Stroke in his father.He reports that he has never smoked. He has never used smokeless tobacco. He reports current alcohol use of about 4.0 standard drinks of alcohol per week. He reports that he does not use drugs.    ROS Review of Systems  Constitutional: Negative for activity change, fatigue and unexpected weight change.  HENT: Negative for congestion, ear pain, hearing loss, postnasal drip and trouble swallowing.    Eyes: Negative for pain and visual disturbance.  Respiratory: Negative for cough, chest tightness and shortness of breath.   Cardiovascular: Negative for chest pain, palpitations and leg swelling.  Gastrointestinal: Negative for abdominal distention, abdominal pain, blood in stool, constipation, diarrhea, nausea and vomiting.  Endocrine: Negative for cold intolerance, heat intolerance and polydipsia.  Genitourinary: Negative for difficulty urinating, dysuria, flank pain, frequency and urgency.  Musculoskeletal: Negative for arthralgias and joint swelling.  Skin: Negative for color change, rash and wound.  Neurological: Negative for dizziness, syncope, speech difficulty, weakness, light-headedness, numbness and headaches.  Hematological: Does not bruise/bleed easily.  Psychiatric/Behavioral: Negative for confusion, decreased concentration, dysphoric mood and sleep disturbance. The patient is not nervous/anxious.     Objective:  BP 102/69   Pulse 68   Temp (!) 97.3 F (36.3 C) (Temporal)   Ht '5\' 10"'$  (1.778 m)   Wt 198 lb (89.8 kg)   SpO2 98%   BMI 28.41 kg/m   BP Readings from Last 3 Encounters:  11/05/18 102/69  08/14/18 117/73  04/10/18 110/66    Wt Readings from Last 3 Encounters:  11/05/18 198 lb (89.8 kg)  08/14/18 199 lb 3.2 oz (90.4 kg)  04/10/18 197 lb 2 oz (89.4 kg)     Physical Exam Constitutional:      Appearance: He is well-developed.  HENT:     Head: Normocephalic and atraumatic.  Eyes:     Pupils: Pupils are equal, round, and reactive to light.  Neck:     Musculoskeletal:  Normal range of motion.     Thyroid: No thyromegaly.     Trachea: No tracheal deviation.  Cardiovascular:     Rate and Rhythm: Normal rate and regular rhythm.     Heart sounds: Normal heart sounds. No murmur. No friction rub. No gallop.   Pulmonary:     Breath sounds: Normal breath sounds. No wheezing or rales.  Abdominal:     General: Bowel sounds are normal. There is no  distension.     Palpations: Abdomen is soft. There is no mass.     Tenderness: There is no abdominal tenderness.     Hernia: There is no hernia in the left inguinal area.  Genitourinary:    Penis: Normal.      Scrotum/Testes: Normal.  Musculoskeletal: Normal range of motion.  Lymphadenopathy:     Cervical: No cervical adenopathy.  Skin:    General: Skin is warm and dry.  Neurological:     Mental Status: He is alert and oriented to person, place, and time.       Assessment & Plan:   Geramy was seen today for annual exam.  Diagnoses and all orders for this visit:  Well adult exam -     CBC with Differential/Platelet -     CMP14+EGFR -     Lipid panel -     PSA Total (Reflex To Free) -     VITAMIN D 25 Hydroxy (Vit-D Deficiency, Fractures) -     Urinalysis  Screening for prostate cancer -     CBC with Differential/Platelet -     CMP14+EGFR -     PSA Total (Reflex To Free)  Vitamin D deficiency -     CBC with Differential/Platelet -     CMP14+EGFR -     VITAMIN D 25 Hydroxy (Vit-D Deficiency, Fractures)  Essential hypertension -     CBC with Differential/Platelet -     CMP14+EGFR -     metoprolol succinate (TOPROL XL) 100 MG 24 hr tablet; Take 1 tablet (100 mg total) by mouth daily. -     lisinopril (ZESTRIL) 20 MG tablet; Take 1 tablet (20 mg total) by mouth daily.  Mixed hyperlipidemia -     CBC with Differential/Platelet -     CMP14+EGFR -     Lipid panel -     simvastatin (ZOCOR) 40 MG tablet; Take 1 tablet (40 mg total) by mouth daily at 6 PM.  Other orders -     famotidine (PEPCID) 40 MG tablet; Take 1 tablet (40 mg total) by mouth daily. -     sildenafil (REVATIO) 20 MG tablet; Take 2-5 pills at once, orally, with each sexual encounter       I have changed [name]'s simvastatin, lisinopril, and famotidine. I am also having him start on sildenafil. Additionally, I am having him maintain his cetirizine, Vitamin D (Ergocalciferol),  fluticasone, and metoprolol succinate.  Allergies as of 11/05/2018   No Known Allergies     Medication List       Accurate as of November 05, 2018 11:47 AM. If you have any questions, ask your nurse or doctor.        cetirizine 10 MG tablet Commonly known as: ZYRTEC Take 10 mg by mouth daily.   famotidine 40 MG tablet Commonly known as: PEPCID Take 1 tablet (40 mg total) by mouth daily.   fluticasone 50 MCG/ACT nasal spray Commonly known as: FLONASE Place 1 spray into both nostrils daily.  lisinopril 20 MG tablet Commonly known as: ZESTRIL Take 1 tablet (20 mg total) by mouth daily.   metoprolol succinate 100 MG 24 hr tablet Commonly known as: Toprol XL Take 1 tablet (100 mg total) by mouth daily.   sildenafil 20 MG tablet Commonly known as: REVATIO Take 2-5 pills at once, orally, with each sexual encounter Started by: Claretta Fraise, MD   simvastatin 40 MG tablet Commonly known as: ZOCOR Take 1 tablet (40 mg total) by mouth daily at 6 PM. What changed: See the new instructions. Changed by: Claretta Fraise, MD   Vitamin D (Ergocalciferol) 1.25 MG (50000 UT) Caps capsule Commonly known as: DRISDOL Take 1 capsule (50,000 Units total) by mouth every 7 (seven) days.        Follow-up: Return in about 1 year (around 11/05/2019).  Claretta Fraise, M.D.

## 2018-11-06 LAB — CMP14+EGFR
ALT: 26 IU/L (ref 0–44)
AST: 20 IU/L (ref 0–40)
Albumin/Globulin Ratio: 2 (ref 1.2–2.2)
Albumin: 5 g/dL — ABNORMAL HIGH (ref 3.8–4.9)
Alkaline Phosphatase: 52 IU/L (ref 39–117)
BUN/Creatinine Ratio: 18 (ref 10–24)
BUN: 18 mg/dL (ref 8–27)
Bilirubin Total: 0.6 mg/dL (ref 0.0–1.2)
CO2: 19 mmol/L — ABNORMAL LOW (ref 20–29)
Calcium: 9.8 mg/dL (ref 8.6–10.2)
Chloride: 103 mmol/L (ref 96–106)
Creatinine, Ser: 1.02 mg/dL (ref 0.76–1.27)
GFR calc Af Amer: 92 mL/min/{1.73_m2} (ref >59)
GFR calc non Af Amer: 80 mL/min/{1.73_m2} (ref >59)
Globulin, Total: 2.5 g/dL (ref 1.5–4.5)
Glucose: 96 mg/dL (ref 65–99)
Potassium: 4.6 mmol/L (ref 3.5–5.2)
Sodium: 141 mmol/L (ref 134–144)
Total Protein: 7.5 g/dL (ref 6.0–8.5)

## 2018-11-06 LAB — CBC WITH DIFFERENTIAL/PLATELET
Basophils Absolute: 0.1 10*3/uL (ref 0.0–0.2)
Basos: 1 %
EOS (ABSOLUTE): 0.3 10*3/uL (ref 0.0–0.4)
Eos: 3 %
Hematocrit: 46.1 % (ref 37.5–51.0)
Hemoglobin: 15.6 g/dL (ref 13.0–17.7)
Immature Grans (Abs): 0.1 10*3/uL (ref 0.0–0.1)
Immature Granulocytes: 2 %
Lymphocytes Absolute: 1.8 10*3/uL (ref 0.7–3.1)
Lymphs: 23 %
MCH: 30.4 pg (ref 26.6–33.0)
MCHC: 33.8 g/dL (ref 31.5–35.7)
MCV: 90 fL (ref 79–97)
Monocytes Absolute: 0.6 10*3/uL (ref 0.1–0.9)
Monocytes: 8 %
Neutrophils Absolute: 4.9 10*3/uL (ref 1.4–7.0)
Neutrophils: 63 %
Platelets: 228 10*3/uL (ref 150–450)
RBC: 5.13 x10E6/uL (ref 4.14–5.80)
RDW: 12.6 % (ref 11.6–15.4)
WBC: 7.8 10*3/uL (ref 3.4–10.8)

## 2018-11-06 LAB — PSA TOTAL (REFLEX TO FREE): Prostate Specific Ag, Serum: 4.8 ng/mL — ABNORMAL HIGH (ref 0.0–4.0)

## 2018-11-06 LAB — FPSA% REFLEX
% FREE PSA: 9.4 %
PSA, FREE: 0.45 ng/mL

## 2018-11-06 LAB — LIPID PANEL
Chol/HDL Ratio: 3.5 ratio (ref 0.0–5.0)
Cholesterol, Total: 140 mg/dL (ref 100–199)
HDL: 40 mg/dL (ref >39)
LDL Chol Calc (NIH): 82 mg/dL (ref 0–99)
Triglycerides: 93 mg/dL (ref 0–149)
VLDL Cholesterol Cal: 18 mg/dL (ref 5–40)

## 2018-11-06 LAB — VITAMIN D 25 HYDROXY (VIT D DEFICIENCY, FRACTURES): Vit D, 25-Hydroxy: 35.9 ng/mL (ref 30.0–100.0)

## 2018-11-07 NOTE — Progress Notes (Signed)
Hello Minor,  Your lab result is normal and/or stable.Some minor variations that are not significant are commonly marked abnormal, but do not represent any medical problem for you.  Best regards, Trayvion Embleton, M.D.

## 2018-11-22 ENCOUNTER — Encounter: Payer: Self-pay | Admitting: Family Medicine

## 2018-11-23 ENCOUNTER — Other Ambulatory Visit: Payer: Self-pay | Admitting: *Deleted

## 2018-11-23 ENCOUNTER — Other Ambulatory Visit: Payer: Self-pay | Admitting: Family Medicine

## 2018-11-23 MED ORDER — SILDENAFIL CITRATE 20 MG PO TABS: ORAL_TABLET | ORAL | 3 refills | Status: DC

## 2018-11-23 MED ORDER — TRIAMCINOLONE ACETONIDE 0.1 % MT PSTE: 1.0000 "application " | PASTE | Freq: Two times a day (BID) | OROMUCOSAL | 12 refills | Status: DC

## 2019-02-09 ENCOUNTER — Other Ambulatory Visit: Payer: Self-pay | Admitting: *Deleted

## 2019-02-09 DIAGNOSIS — I10 Essential (primary) hypertension: Secondary | ICD-10-CM

## 2019-02-09 MED ORDER — METOPROLOL SUCCINATE ER 100 MG PO TB24: 100.0000 mg | ORAL_TABLET | Freq: Every day | ORAL | 1 refills | Status: DC

## 2019-02-09 NOTE — Telephone Encounter (Signed)
OV 11/05/18 rtc 1 yr

## 2019-02-17 ENCOUNTER — Other Ambulatory Visit: Payer: Self-pay | Admitting: Family Medicine

## 2019-05-18 DIAGNOSIS — L432 Lichenoid drug reaction: Secondary | ICD-10-CM | POA: Diagnosis not present

## 2019-08-24 DIAGNOSIS — R509 Fever, unspecified: Secondary | ICD-10-CM | POA: Diagnosis not present

## 2019-08-24 DIAGNOSIS — J0100 Acute maxillary sinusitis, unspecified: Secondary | ICD-10-CM | POA: Diagnosis not present

## 2019-08-24 DIAGNOSIS — Z6827 Body mass index (BMI) 27.0-27.9, adult: Secondary | ICD-10-CM | POA: Diagnosis not present

## 2019-08-24 DIAGNOSIS — J439 Emphysema, unspecified: Secondary | ICD-10-CM | POA: Diagnosis not present

## 2019-08-24 DIAGNOSIS — I10 Essential (primary) hypertension: Secondary | ICD-10-CM | POA: Diagnosis not present

## 2019-08-24 DIAGNOSIS — R05 Cough: Secondary | ICD-10-CM | POA: Diagnosis not present

## 2019-08-26 ENCOUNTER — Encounter: Payer: Self-pay | Admitting: Family Medicine

## 2019-08-29 DIAGNOSIS — R05 Cough: Secondary | ICD-10-CM | POA: Diagnosis not present

## 2019-08-29 DIAGNOSIS — J189 Pneumonia, unspecified organism: Secondary | ICD-10-CM | POA: Diagnosis not present

## 2019-08-29 DIAGNOSIS — U071 COVID-19: Secondary | ICD-10-CM | POA: Diagnosis not present

## 2019-08-29 DIAGNOSIS — J1282 Pneumonia due to coronavirus disease 2019: Secondary | ICD-10-CM | POA: Diagnosis not present

## 2019-08-29 DIAGNOSIS — K529 Noninfective gastroenteritis and colitis, unspecified: Secondary | ICD-10-CM | POA: Diagnosis not present

## 2019-09-03 DIAGNOSIS — R05 Cough: Secondary | ICD-10-CM | POA: Diagnosis not present

## 2019-09-03 DIAGNOSIS — R630 Anorexia: Secondary | ICD-10-CM | POA: Diagnosis not present

## 2019-09-03 DIAGNOSIS — U071 COVID-19: Secondary | ICD-10-CM | POA: Diagnosis not present

## 2019-10-18 ENCOUNTER — Other Ambulatory Visit: Payer: Self-pay | Admitting: Family Medicine

## 2019-10-18 DIAGNOSIS — I10 Essential (primary) hypertension: Secondary | ICD-10-CM

## 2019-11-08 ENCOUNTER — Ambulatory Visit (INDEPENDENT_AMBULATORY_CARE_PROVIDER_SITE_OTHER): Payer: BC Managed Care – PPO | Admitting: Family Medicine

## 2019-11-08 ENCOUNTER — Encounter: Payer: Self-pay | Admitting: Family Medicine

## 2019-11-08 ENCOUNTER — Other Ambulatory Visit: Payer: Self-pay

## 2019-11-08 VITALS — BP 122/80 | HR 82 | Temp 97.7°F | Resp 20 | Ht 70.0 in | Wt 192.0 lb

## 2019-11-08 DIAGNOSIS — Z125 Encounter for screening for malignant neoplasm of prostate: Secondary | ICD-10-CM | POA: Diagnosis not present

## 2019-11-08 DIAGNOSIS — Z0000 Encounter for general adult medical examination without abnormal findings: Secondary | ICD-10-CM | POA: Diagnosis not present

## 2019-11-08 DIAGNOSIS — E782 Mixed hyperlipidemia: Secondary | ICD-10-CM | POA: Diagnosis not present

## 2019-11-08 DIAGNOSIS — I10 Essential (primary) hypertension: Secondary | ICD-10-CM | POA: Diagnosis not present

## 2019-11-08 DIAGNOSIS — Z0001 Encounter for general adult medical examination with abnormal findings: Secondary | ICD-10-CM

## 2019-11-08 DIAGNOSIS — E559 Vitamin D deficiency, unspecified: Secondary | ICD-10-CM

## 2019-11-08 LAB — URINALYSIS
Bilirubin, UA: NEGATIVE
Glucose, UA: NEGATIVE
Ketones, UA: NEGATIVE
Leukocytes,UA: NEGATIVE
Nitrite, UA: NEGATIVE
Protein,UA: NEGATIVE
RBC, UA: NEGATIVE
Specific Gravity, UA: 1.025 (ref 1.005–1.030)
Urobilinogen, Ur: 0.2 mg/dL (ref 0.2–1.0)
pH, UA: 6 (ref 5.0–7.5)

## 2019-11-08 MED ORDER — FAMOTIDINE 40 MG PO TABS: 40.0000 mg | ORAL_TABLET | Freq: Every day | ORAL | 3 refills | Status: DC

## 2019-11-08 MED ORDER — LISINOPRIL 20 MG PO TABS: 20.0000 mg | ORAL_TABLET | Freq: Every day | ORAL | 3 refills | Status: DC

## 2019-11-08 MED ORDER — FEXOFENADINE-PSEUDOEPHED ER 180-240 MG PO TB24: 1.0000 | ORAL_TABLET | Freq: Every day | ORAL | 3 refills | Status: DC

## 2019-11-08 MED ORDER — SILDENAFIL CITRATE 20 MG PO TABS: ORAL_TABLET | ORAL | 3 refills | Status: DC

## 2019-11-08 MED ORDER — METOPROLOL SUCCINATE ER 100 MG PO TB24: 100.0000 mg | ORAL_TABLET | Freq: Every day | ORAL | 3 refills | Status: DC

## 2019-11-08 MED ORDER — SIMVASTATIN 40 MG PO TABS: 40.0000 mg | ORAL_TABLET | Freq: Every day | ORAL | 3 refills | Status: DC

## 2019-11-08 NOTE — Progress Notes (Addendum)
Subjective:  Patient ID: Edward Taylor, male    DOB: 1955/05/14  Age: 64 y.o. MRN: [id number]  CC: Annual Exam   HPI Edward Taylor presents for CPE &  follow-up of hypertension , cholesterol elevation. Patient has no history of headache chest pain or shortness of breath or recent cough. Patient also denies symptoms of TIA such as focal numbness or weakness. Patient denies side effects from medication. States taking it regularly.   in for follow-up of elevated cholesterol. Doing well without complaints on current medication. Denies side effects of statin including myalgia and arthralgia and nausea. Currently no chest pain, shortness of breath or other cardiovascular related symptoms noted.  Had CoVID 2 months ago. Still feels his strength isn't back completely.   SIlenafil helping with E.D. satisfactorily   History Edward Taylor has a past medical history of Allergy, GERD (gastroesophageal reflux disease), Hyperlipidemia, and Hypertension.   Edward Taylor has no past surgical history on file.   His family history includes Arthritis in his father; Cancer in his mother; Diabetes in his father; Heart disease in his maternal grandfather and paternal grandfather; Hyperlipidemia in his father; Hypertension in his father and mother; Stroke in his father.Edward Taylor reports that Edward Taylor has never smoked. Edward Taylor has never used smokeless tobacco. Edward Taylor reports current alcohol use of about 4.0 standard drinks of alcohol per week. Edward Taylor reports that Edward Taylor does not use drugs.  Current Outpatient Medications on File Prior to Visit  Medication Sig Dispense Refill  . cetirizine (ZYRTEC) 10 MG tablet Take 10 mg by mouth daily.    . fluticasone (FLONASE) 50 MCG/ACT nasal spray Place 1 spray into both nostrils daily. 150 g 1   No current facility-administered medications on file prior to visit.    ROS Review of Systems  Constitutional: Negative for activity change, fatigue and unexpected weight change.  HENT: Negative for congestion, ear  pain, hearing loss, postnasal drip and trouble swallowing.   Eyes: Negative for pain and visual disturbance.  Respiratory: Negative for cough, chest tightness and shortness of breath.   Cardiovascular: Negative for chest pain, palpitations and leg swelling.  Gastrointestinal: Negative for abdominal distention, abdominal pain, blood in stool, constipation, diarrhea, nausea and vomiting.  Endocrine: Negative for cold intolerance, heat intolerance and polydipsia.  Genitourinary: Negative for difficulty urinating, dysuria, flank pain, frequency and urgency.  Musculoskeletal: Negative for arthralgias and joint swelling.  Skin: Negative for color change, rash and wound.  Neurological: Negative for dizziness, syncope, speech difficulty, weakness, light-headedness, numbness and headaches.  Hematological: Does not bruise/bleed easily.  Psychiatric/Behavioral: Negative for confusion, decreased concentration, dysphoric mood and sleep disturbance. The patient is not nervous/anxious.     Objective:  BP 122/80   Pulse 82   Temp 97.7 F (36.5 C) (Temporal)   Resp 20   Ht _0  (1.778 m)   Wt 192 lb (87.1 kg)   SpO2 98%   BMI 27.55 kg/m   BP Readings from Last 3 Encounters:  11/08/19 122/80  11/05/18 102/69  08/14/18 117/73    Wt Readings from Last 3 Encounters:  11/08/19 192 lb (87.1 kg)  11/05/18 198 lb (89.8 kg)  08/14/18 199 lb 3.2 oz (90.4 kg)     Physical Exam Constitutional:      Appearance: Edward Taylor is well-developed.  HENT:     Head: Normocephalic and atraumatic.  Eyes:     Pupils: Pupils are equal, round, and reactive to light.  Neck:     Thyroid: No thyromegaly.     Trachea: No  tracheal deviation.  Cardiovascular:     Rate and Rhythm: Normal rate and regular rhythm.     Heart sounds: Normal heart sounds. No murmur heard.  No friction rub. No gallop.   Pulmonary:     Breath sounds: Normal breath sounds. No wheezing or rales.  Abdominal:     General: Bowel sounds are  normal. There is no distension.     Palpations: Abdomen is soft. There is no mass.     Tenderness: There is no abdominal tenderness.     Hernia: There is no hernia in the left inguinal area.  Genitourinary:    Penis: Normal.      Testes: Normal.  Musculoskeletal:        General: Normal range of motion.     Cervical back: Normal range of motion.  Lymphadenopathy:     Cervical: No cervical adenopathy.  Skin:    General: Skin is warm and dry.  Neurological:     Mental Status: Edward Taylor is alert and oriented to person, place, and time.       Assessment & Plan:   Edward Taylor was seen today for annual exam.  Diagnoses and all orders for this visit:  Well adult exam -     CBC with Differential/Platelet -     CMP14+EGFR -     Lipid panel -     Urinalysis  Essential hypertension -     lisinopril (ZESTRIL) 20 MG tablet; Take 1 tablet (20 mg total) by mouth daily. -     metoprolol succinate (TOPROL-XL) 100 MG 24 hr tablet; Take 1 tablet (100 mg total) by mouth daily. Take with or immediately following a meal. -     CBC with Differential/Platelet -     CMP14+EGFR -     Urinalysis  Mixed hyperlipidemia -     simvastatin (ZOCOR) 40 MG tablet; Take 1 tablet (40 mg total) by mouth daily at 6 PM. -     CBC with Differential/Platelet -     CMP14+EGFR -     Lipid panel  Vitamin D deficiency -     CBC with Differential/Platelet -     CMP14+EGFR -     VITAMIN D 25 Hydroxy (Vit-D Deficiency, Fractures)  Screening for prostate cancer -     CBC with Differential/Platelet -     CMP14+EGFR -     PSA Total (Reflex To Free)  Other orders -     fexofenadine-pseudoephedrine (ALLEGRA-D 24) 180-240 MG 24 hr tablet; Take 1 tablet by mouth daily. -     sildenafil (REVATIO) 20 MG tablet; TAKE 2-5 PILLS AT ONCE BY MOUTH WITH EACH SEXUAL ENCOUNTER -     famotidine (PEPCID) 40 MG tablet; Take 1 tablet (40 mg total) by mouth daily.   Allergies as of 11/08/2019   No Known Allergies     Medication List         Accurate as of November 08, 2019 10:17 AM. If you have any questions, ask your nurse or doctor.        STOP taking these medications   triamcinolone 0.1 % paste Commonly known as: KENALOG Stopped by: Claretta Fraise, MD   Vitamin D (Ergocalciferol) 1.25 MG (50000 UNIT) Caps capsule Commonly known as: DRISDOL Stopped by: Claretta Fraise, MD     TAKE these medications   cetirizine 10 MG tablet Commonly known as: ZYRTEC Take 10 mg by mouth daily.   famotidine 40 MG tablet Commonly known as: PEPCID Take 1 tablet (40  mg total) by mouth daily.   fexofenadine-pseudoephedrine 180-240 MG 24 hr tablet Commonly known as: ALLEGRA-D 24 Take 1 tablet by mouth daily.   fluticasone 50 MCG/ACT nasal spray Commonly known as: FLONASE Place 1 spray into both nostrils daily.   lisinopril 20 MG tablet Commonly known as: ZESTRIL Take 1 tablet (20 mg total) by mouth daily.   metoprolol succinate 100 MG 24 hr tablet Commonly known as: TOPROL-XL Take 1 tablet (100 mg total) by mouth daily. Take with or immediately following a meal. What changed: additional instructions Changed by: Claretta Fraise, MD   sildenafil 20 MG tablet Commonly known as: REVATIO TAKE 2-5 PILLS AT ONCE BY MOUTH WITH EACH SEXUAL ENCOUNTER   simvastatin 40 MG tablet Commonly known as: ZOCOR Take 1 tablet (40 mg total) by mouth daily at 6 PM.       Meds ordered this encounter  Medications  . fexofenadine-pseudoephedrine (ALLEGRA-D 24) 180-240 MG 24 hr tablet    Sig: Take 1 tablet by mouth daily.    Dispense:  90 tablet    Refill:  3  . lisinopril (ZESTRIL) 20 MG tablet    Sig: Take 1 tablet (20 mg total) by mouth daily.    Dispense:  90 tablet    Refill:  3  . sildenafil (REVATIO) 20 MG tablet    Sig: TAKE 2-5 PILLS AT ONCE BY MOUTH WITH EACH SEXUAL ENCOUNTER    Dispense:  100 tablet    Refill:  3  . simvastatin (ZOCOR) 40 MG tablet    Sig: Take 1 tablet (40 mg total) by mouth daily at 6 PM.    Dispense:   90 tablet    Refill:  3  . metoprolol succinate (TOPROL-XL) 100 MG 24 hr tablet    Sig: Take 1 tablet (100 mg total) by mouth daily. Take with or immediately following a meal.    Dispense:  90 tablet    Refill:  3  . famotidine (PEPCID) 40 MG tablet    Sig: Take 1 tablet (40 mg total) by mouth daily.    Dispense:  90 tablet    Refill:  3      Follow-up: Return in about 1 year (around 11/07/2020).  Claretta Fraise, M.D.

## 2019-11-09 ENCOUNTER — Other Ambulatory Visit: Payer: Self-pay | Admitting: Family Medicine

## 2019-11-09 DIAGNOSIS — R972 Elevated prostate specific antigen [PSA]: Secondary | ICD-10-CM

## 2019-11-09 LAB — CMP14+EGFR
ALT: 55 IU/L — ABNORMAL HIGH (ref 0–44)
AST: 39 IU/L (ref 0–40)
Albumin/Globulin Ratio: 2 (ref 1.2–2.2)
Albumin: 5.1 g/dL — ABNORMAL HIGH (ref 3.8–4.8)
Alkaline Phosphatase: 66 IU/L (ref 44–121)
BUN/Creatinine Ratio: 20 (ref 10–24)
BUN: 17 mg/dL (ref 8–27)
Bilirubin Total: 0.4 mg/dL (ref 0.0–1.2)
CO2: 24 mmol/L (ref 20–29)
Calcium: 9.7 mg/dL (ref 8.6–10.2)
Chloride: 98 mmol/L (ref 96–106)
Creatinine, Ser: 0.87 mg/dL (ref 0.76–1.27)
GFR calc Af Amer: 108 mL/min/{1.73_m2} (ref >59)
GFR calc non Af Amer: 93 mL/min/{1.73_m2} (ref >59)
Globulin, Total: 2.5 g/dL (ref 1.5–4.5)
Glucose: 109 mg/dL — ABNORMAL HIGH (ref 65–99)
Potassium: 4.7 mmol/L (ref 3.5–5.2)
Sodium: 139 mmol/L (ref 134–144)
Total Protein: 7.6 g/dL (ref 6.0–8.5)

## 2019-11-09 LAB — LIPID PANEL
Chol/HDL Ratio: 3.3 ratio (ref 0.0–5.0)
Cholesterol, Total: 148 mg/dL (ref 100–199)
HDL: 45 mg/dL (ref >39)
LDL Chol Calc (NIH): 79 mg/dL (ref 0–99)
Triglycerides: 133 mg/dL (ref 0–149)
VLDL Cholesterol Cal: 24 mg/dL (ref 5–40)

## 2019-11-09 LAB — CBC WITH DIFFERENTIAL/PLATELET
Basophils Absolute: 0.1 10*3/uL (ref 0.0–0.2)
Basos: 1 %
EOS (ABSOLUTE): 0.3 10*3/uL (ref 0.0–0.4)
Eos: 4 %
Hematocrit: 47.1 % (ref 37.5–51.0)
Hemoglobin: 15.6 g/dL (ref 13.0–17.7)
Immature Grans (Abs): 0.1 10*3/uL (ref 0.0–0.1)
Immature Granulocytes: 1 %
Lymphocytes Absolute: 1.6 10*3/uL (ref 0.7–3.1)
Lymphs: 22 %
MCH: 30.4 pg (ref 26.6–33.0)
MCHC: 33.1 g/dL (ref 31.5–35.7)
MCV: 92 fL (ref 79–97)
Monocytes Absolute: 0.6 10*3/uL (ref 0.1–0.9)
Monocytes: 9 %
Neutrophils Absolute: 4.6 10*3/uL (ref 1.4–7.0)
Neutrophils: 63 %
Platelets: 257 10*3/uL (ref 150–450)
RBC: 5.13 x10E6/uL (ref 4.14–5.80)
RDW: 12.9 % (ref 11.6–15.4)
WBC: 7.3 10*3/uL (ref 3.4–10.8)

## 2019-11-09 LAB — FPSA% REFLEX
% FREE PSA: 7.6 %
PSA, FREE: 0.57 ng/mL

## 2019-11-09 LAB — PSA TOTAL (REFLEX TO FREE): Prostate Specific Ag, Serum: 7.5 ng/mL — ABNORMAL HIGH (ref 0.0–4.0)

## 2019-11-09 LAB — VITAMIN D 25 HYDROXY (VIT D DEFICIENCY, FRACTURES): Vit D, 25-Hydroxy: 45.2 ng/mL (ref 30.0–100.0)

## 2019-11-28 ENCOUNTER — Other Ambulatory Visit: Payer: Self-pay | Admitting: Family Medicine

## 2019-11-28 DIAGNOSIS — E782 Mixed hyperlipidemia: Secondary | ICD-10-CM

## 2019-11-30 DIAGNOSIS — C44319 Basal cell carcinoma of skin of other parts of face: Secondary | ICD-10-CM | POA: Diagnosis not present

## 2019-11-30 DIAGNOSIS — L570 Actinic keratosis: Secondary | ICD-10-CM | POA: Diagnosis not present

## 2019-11-30 DIAGNOSIS — L820 Inflamed seborrheic keratosis: Secondary | ICD-10-CM | POA: Diagnosis not present

## 2019-12-23 ENCOUNTER — Ambulatory Visit (INDEPENDENT_AMBULATORY_CARE_PROVIDER_SITE_OTHER): Payer: BC Managed Care – PPO | Admitting: Urology

## 2019-12-23 ENCOUNTER — Encounter: Payer: Self-pay | Admitting: Urology

## 2019-12-23 ENCOUNTER — Other Ambulatory Visit: Payer: Self-pay

## 2019-12-23 VITALS — BP 131/84 | HR 82 | Temp 98.9°F | Ht 70.0 in | Wt 195.0 lb

## 2019-12-23 DIAGNOSIS — R972 Elevated prostate specific antigen [PSA]: Secondary | ICD-10-CM

## 2019-12-23 LAB — URINALYSIS, ROUTINE W REFLEX MICROSCOPIC
Bilirubin, UA: NEGATIVE
Glucose, UA: NEGATIVE
Ketones, UA: NEGATIVE
Leukocytes,UA: NEGATIVE
Nitrite, UA: NEGATIVE
Protein,UA: NEGATIVE
RBC, UA: NEGATIVE
Specific Gravity, UA: 1.025 (ref 1.005–1.030)
Urobilinogen, Ur: 0.2 mg/dL (ref 0.2–1.0)
pH, UA: 5 (ref 5.0–7.5)

## 2019-12-23 LAB — BLADDER SCAN AMB NON-IMAGING: Scan Result: 88

## 2019-12-23 NOTE — Progress Notes (Signed)

## 2019-12-23 NOTE — Patient Instructions (Signed)
Prostate-Specific Antigen Test Why am I having this test? The prostate-specific antigen (PSA) test is a screening test for prostate cancer. It can identify early signs of prostate cancer, which may allow for more effective treatment. Your health care provider may recommend that you have a PSA test starting at age 62 or that you have one earlier or later, depending on your risk factors for prostate cancer. You may also have a PSA test:  To monitor treatment of prostate cancer.  To check whether prostate cancer has returned after treatment.  If you have signs of other conditions that can affect PSA levels, such as: ? An enlarged prostate that is not caused by cancer (benign prostatic hyperplasia, BPH). This condition is very common in older men. ? A prostate infection. What is being tested? This test measures the amount of PSA in your blood. PSA is a protein that is made in the prostate. The prostate naturally produces more PSA as you age, but very high levels may be a sign of a medical condition. What kind of sample is taken?  A blood sample is required for this test. It is usually collected by inserting a needle into a blood vessel or by sticking a finger with a small needle. Blood for this test should be drawn before having an exam of the prostate. How do I prepare for this test? Do not ejaculate starting 24 hours before your test, or as long as told by your health care provider. Tell a health care provider about:  Any allergies you have.  All medicines you are taking, including vitamins, herbs, eye drops, creams, and over-the-counter medicines. This also includes: ? Medicines to assist with hair growth, such as finasteride. ? Any recent exposure to a medicine called diethylstilbestrol.  Any blood disorders you have.  Any recent procedures you have had, especially any procedures involving the prostate or rectum.  Any medical conditions you have.  Any recent urinary tract infections  (UTIs) you have had. How are the results reported? Your test results will be reported as a value that indicates how much PSA is in your blood. This will be given as nanograms of PSA per milliliter of blood (ng/mL). Your health care provider will compare your results to normal ranges that were established after testing a large group of people (reference ranges). Reference ranges may vary among labs and hospitals. PSA levels vary from person to person and generally increase with age. Because of this variation, there is no single PSA value that is considered normal for everyone. Instead, PSA reference ranges are used to describe whether your PSA levels are considered low or high (elevated). Common reference ranges are:  Low: 0-2.5 ng/mL.  Slightly to moderately elevated: 2.6-10.0 ng/mL.  Moderately elevated: 10.0-19.9 ng/mL.  Significantly elevated: 20 ng/mL or greater. Sometimes, the test results may report that a condition is present when it is not present (false-positive result). What do the results mean? A test result that is higher than 4 ng/mL may mean that you are at an increased risk for prostate cancer. However, a PSA test by itself is not enough to diagnose prostate cancer. High PSA levels may also be caused by the natural aging process, prostate infection, or BPH. PSA screening cannot tell you if your PSA is high due to cancer or a different cause. A prostate biopsy is the only way to diagnose prostate cancer. A risk of having the PSA test is diagnosing and treating prostate cancer that would never have caused any   symptoms or problems (overdiagnosis and overtreatment). Talk with your health care provider about what your results mean. Questions to ask your health care provider Ask your health care provider, or the department that is doing the test:  When will my results be ready?  How will I get my results?  What are my treatment options?  What other tests do I need?  What are my  next steps? Summary  The prostate-specific antigen (PSA) test is a screening test for prostate cancer.  Your health care provider may recommend that you have a PSA test starting at age 62 or that you have one earlier or later, depending on your risk factors for prostate cancer.  A test result that is higher than 4 ng/mL may mean that you are at an increased risk for prostate cancer. However, elevated levels can be caused by a number of conditions other than prostate cancer.  Talk with your health care provider about what your results mean. This information is not intended to replace advice given to you by your health care provider. Make sure you discuss any questions you have with your health care provider. Document Revised: 01/03/2017 Document Reviewed: 10/28/2016 Elsevier Patient Education  2020 [employer]  

## 2019-12-23 NOTE — Progress Notes (Signed)
12/23/2019 8:47 AM   [name] Jan 14, 1956 [id number]  Referring provider: Claretta Fraise, MD [place],  [place] [address]  Elevated PSA  HPI: Mr Strauch is a 64yo here for evaluation of elevated PSA. PSA was 4.3 and 4.8 1 year ago. PSA then increased to 7.5. No significant LUTS.  No feeling of incomplete emptying.  He is on allegra D for the past 2 years. Mother had 2 brothers diagnosed with prostate cancer in their 102s and lived into their 76s.    PMH: Past Medical History:  Diagnosis Date   Allergy    Basal cell carcinoma    GERD (gastroesophageal reflux disease)    Hyperlipidemia    Hypertension     Surgical History: History reviewed. No pertinent surgical history.  Home Medications:  Allergies as of 12/23/2019   No Known Allergies     Medication List       Accurate as of December 23, 2019  8:47 AM. If you have any questions, ask your nurse or doctor.        cetirizine 10 MG tablet Commonly known as: ZYRTEC Take 10 mg by mouth daily.   famotidine 40 MG tablet Commonly known as: PEPCID Take 1 tablet (40 mg total) by mouth daily.   ALLEGRA-D 24 HOUR PO   fexofenadine-pseudoephedrine 180-240 MG 24 hr tablet Commonly known as: ALLEGRA-D 24 Take 1 tablet by mouth daily.   fluticasone 50 MCG/ACT nasal spray Commonly known as: FLONASE Place 1 spray into both nostrils daily.   lisinopril 20 MG tablet Commonly known as: ZESTRIL Take 1 tablet (20 mg total) by mouth daily.   metoprolol succinate 100 MG 24 hr tablet Commonly known as: TOPROL-XL Take 1 tablet (100 mg total) by mouth daily. Take with or immediately following a meal.   sildenafil 20 MG tablet Commonly known as: REVATIO TAKE 2-5 PILLS AT ONCE BY MOUTH WITH EACH SEXUAL ENCOUNTER   simvastatin 40 MG tablet Commonly known as: ZOCOR TAKE 1 TABLET DAILY AT     6:00PM       Allergies: No Known Allergies  Family History: Family History  Problem Relation Age of Onset     Cancer Mother    Hypertension Mother    Arthritis Father    Diabetes Father    Hyperlipidemia Father    Hypertension Father    Stroke Father    Heart disease Maternal Grandfather    Heart disease Paternal Grandfather     Social History:  reports that he has never smoked. He has never used smokeless tobacco. He reports current alcohol use of about 4.0 standard drinks of alcohol per week. He reports that he does not use drugs.  ROS: All other review of systems were reviewed and are negative except what is noted above in HPI  Physical Exam: BP 131/84    Pulse 82    Temp 98.9 F (37.2 C)    Ht 5\' 10"  (1.778 m)    Wt 195 lb (88.5 kg)    BMI 27.98 kg/m   Constitutional:  Alert and oriented, No acute distress. HEENT: [place] AT, moist mucus membranes.  Trachea midline, no masses. Cardiovascular: No clubbing, cyanosis, or edema. Respiratory: Normal respiratory effort, no increased work of breathing. GI: Abdomen is soft, nontender, nondistended, no abdominal masses GU: No CVA tenderness. Circumcised phallus. No masses/lesions on penis, testis, scrotum. Prostate 20g smooth no nodules no induration.  Lymph: No cervical or inguinal lymphadenopathy. Skin: No rashes, bruises or suspicious lesions.  Neurologic: Grossly intact, no focal deficits, moving all 4 extremities. Psychiatric: Normal mood and affect.  Laboratory Data: Lab Results  Component Value Date   WBC 7.3 11/08/2019   HGB 15.6 11/08/2019   HCT 47.1 11/08/2019   MCV 92 11/08/2019   PLT 257 11/08/2019    Lab Results  Component Value Date   CREATININE 0.87 11/08/2019    No results found for: PSA  No results found for: TESTOSTERONE  No results found for: HGBA1C  Urinalysis    Component Value Date/Time   APPEARANCEUR Clear 11/08/2019 1024   GLUCOSEU Negative 11/08/2019 1024   BILIRUBINUR Negative 11/08/2019 1024   PROTEINUR Negative 11/08/2019 1024   NITRITE Negative 11/08/2019 1024   LEUKOCYTESUR Negative  11/08/2019 1024    No results found for: LABMICR, [place], RBCUA, LABEPIT, MUCUS, BACTERIA  Pertinent Imaging:  No results found for this or any previous visit.  No results found for this or any previous visit.  No results found for this or any previous visit.  No results found for this or any previous visit.  No results found for this or any previous visit.  No results found for this or any previous visit.  No results found for this or any previous visit.  No results found for this or any previous visit.   Assessment & Plan:    1. Abnormal PSA -PSA today, will call with results. If it is persistently elevated we will proceed with prostate biopsy.  - Urinalysis, Routine w reflex microscopic   No follow-ups on file.  [name], MD  [facility] [place]

## 2019-12-24 LAB — PSA: Prostate Specific Ag, Serum: 7.4 ng/mL — ABNORMAL HIGH (ref 0.0–4.0)

## 2019-12-29 ENCOUNTER — Telehealth: Payer: Self-pay

## 2019-12-29 DIAGNOSIS — R972 Elevated prostate specific antigen [PSA]: Secondary | ICD-10-CM

## 2019-12-29 MED ORDER — LEVOFLOXACIN 750 MG PO TABS: 750.0000 mg | ORAL_TABLET | Freq: Every day | ORAL | 0 refills | Status: DC

## 2019-12-29 NOTE — Telephone Encounter (Signed)
Pt called and made aware. Biopsy appt made. Instruction went over with patient via phone and mailed.

## 2019-12-29 NOTE — Telephone Encounter (Signed)
-----   Message from Cleon Gustin, MD sent at 12/29/2019 10:53 AM EST ----- PSa remains high. He needs to be scheduled for a prostate biopsy ----- Message ----- From: Iris Pert, LPN Sent: [phone]   8:40 AM EST To: Cleon Gustin, MD  Please review

## 2020-01-03 MED ORDER — DIAZEPAM 10 MG PO TABS: 10.0000 mg | ORAL_TABLET | Freq: Once | ORAL | 0 refills | Status: AC

## 2020-01-17 ENCOUNTER — Encounter: Payer: Self-pay | Admitting: *Deleted

## 2020-01-20 ENCOUNTER — Telehealth: Payer: Self-pay

## 2020-01-20 NOTE — Telephone Encounter (Signed)
Pt called saying he was cancelling his biopsy appt d/t his last PSA result dropping 2 points.

## 2020-02-02 ENCOUNTER — Encounter (HOSPITAL_COMMUNITY): Payer: Self-pay

## 2020-02-02 ENCOUNTER — Other Ambulatory Visit: Payer: BC Managed Care – PPO | Admitting: Urology

## 2020-02-02 ENCOUNTER — Ambulatory Visit (HOSPITAL_COMMUNITY): Payer: BC Managed Care – PPO

## 2020-02-16 ENCOUNTER — Ambulatory Visit: Payer: BC Managed Care – PPO | Admitting: Urology

## 2020-03-06 DIAGNOSIS — H43821 Vitreomacular adhesion, right eye: Secondary | ICD-10-CM | POA: Diagnosis not present

## 2020-03-06 DIAGNOSIS — H35371 Puckering of macula, right eye: Secondary | ICD-10-CM | POA: Diagnosis not present

## 2020-03-06 DIAGNOSIS — H43812 Vitreous degeneration, left eye: Secondary | ICD-10-CM | POA: Diagnosis not present

## 2020-05-02 DIAGNOSIS — H43821 Vitreomacular adhesion, right eye: Secondary | ICD-10-CM | POA: Diagnosis not present

## 2020-05-02 DIAGNOSIS — H35371 Puckering of macula, right eye: Secondary | ICD-10-CM | POA: Diagnosis not present

## 2020-05-02 DIAGNOSIS — H43812 Vitreous degeneration, left eye: Secondary | ICD-10-CM | POA: Diagnosis not present

## 2020-05-28 ENCOUNTER — Other Ambulatory Visit: Payer: Self-pay | Admitting: Family Medicine

## 2020-05-28 DIAGNOSIS — E782 Mixed hyperlipidemia: Secondary | ICD-10-CM

## 2020-05-31 DIAGNOSIS — C44519 Basal cell carcinoma of skin of other part of trunk: Secondary | ICD-10-CM | POA: Diagnosis not present

## 2020-06-14 ENCOUNTER — Other Ambulatory Visit: Payer: Self-pay

## 2020-06-14 ENCOUNTER — Other Ambulatory Visit: Payer: BC Managed Care – PPO

## 2020-06-14 DIAGNOSIS — R972 Elevated prostate specific antigen [PSA]: Secondary | ICD-10-CM | POA: Diagnosis not present

## 2020-06-15 LAB — PSA: Prostate Specific Ag, Serum: 7.8 ng/mL — ABNORMAL HIGH (ref 0.0–4.0)

## 2020-06-21 ENCOUNTER — Ambulatory Visit (INDEPENDENT_AMBULATORY_CARE_PROVIDER_SITE_OTHER): Payer: BC Managed Care – PPO | Admitting: Urology

## 2020-06-21 ENCOUNTER — Encounter: Payer: Self-pay | Admitting: Urology

## 2020-06-21 ENCOUNTER — Other Ambulatory Visit: Payer: Self-pay

## 2020-06-21 VITALS — BP 129/85 | HR 78 | Temp 98.2°F | Resp 16 | Ht 70.0 in | Wt 208.0 lb

## 2020-06-21 DIAGNOSIS — R972 Elevated prostate specific antigen [PSA]: Secondary | ICD-10-CM

## 2020-06-21 LAB — URINALYSIS, ROUTINE W REFLEX MICROSCOPIC
Bilirubin, UA: NEGATIVE
Glucose, UA: NEGATIVE
Ketones, UA: NEGATIVE
Leukocytes,UA: NEGATIVE
Nitrite, UA: NEGATIVE
Protein,UA: NEGATIVE
RBC, UA: NEGATIVE
Specific Gravity, UA: 1.025 (ref 1.005–1.030)
Urobilinogen, Ur: 0.2 mg/dL (ref 0.2–1.0)
pH, UA: 5.5 (ref 5.0–7.5)

## 2020-06-21 NOTE — Progress Notes (Signed)
Urological Symptom Review  Patient is experiencing the following symptoms: None   Review of Systems  Gastrointestinal (upper)  : Negative for upper GI symptoms  Gastrointestinal (lower) : Diarrhea  Constitutional : Night Sweats  Skin: Skin rash/lesion Itching  Eyes: Blurred vision  Ear/Nose/Throat : Negative for Ear/Nose/Throat symptoms  Hematologic/Lymphatic: Negative for Hematologic/Lymphatic symptoms  Cardiovascular : Negative for cardiovascular symptoms  Respiratory : Negative for respiratory symptoms  Endocrine: Negative for endocrine symptoms  Musculoskeletal: Negative for musculoskeletal symptoms  Neurological: Negative for neurological symptoms  Psychologic: Negative for psychiatric symptoms

## 2020-06-21 NOTE — Patient Instructions (Signed)
  Prostate-Specific Antigen Test Why am I having this test? The prostate-specific antigen (PSA) test is a screening test for prostate cancer. It can identify early signs of prostate cancer, which may allow for more effective treatment. Your health care provider may recommend that you have a PSA test starting at age 63 or that you have one earlier or later, depending on your risk factors for prostate cancer. You may also have a PSA test:  To monitor treatment of prostate cancer.  To check whether prostate cancer has returned after treatment.  If you have signs of other conditions that can affect PSA levels, such as: ? An enlarged prostate that is not caused by cancer (benign prostatic hyperplasia, BPH). This condition is very common in older men. ? A prostate infection. What is being tested? This test measures the amount of PSA in your blood. PSA is a protein that is made in the prostate. The prostate naturally produces more PSA as you age, but very high levels may be a sign of a medical condition. What kind of sample is taken? A blood sample is required for this test. It is usually collected by inserting a needle into a blood vessel or by sticking a finger with a small needle. Blood for this test should be drawn before having an exam of the prostate.   How do I prepare for this test? Do not ejaculate starting 24 hours before your test, or as long as told by your health care provider. Tell a health care provider about:  Any allergies you have.  All medicines you are taking, including vitamins, herbs, eye drops, creams, and over-the-counter medicines. This also includes: ? Medicines to assist with hair growth, such as finasteride. ? Any recent exposure to a medicine called diethylstilbestrol.  Any blood disorders you have.  Any recent procedures you have had, especially any procedures involving the prostate or rectum.  Any medical conditions you have.  Any recent urinary tract  infections (UTIs) you have had. How are the results reported? Your test results will be reported as a value that indicates how much PSA is in your blood. This will be given as nanograms of PSA per milliliter of blood (ng/mL). Your health care provider will compare your results to normal ranges that were established after testing a large group of people (reference ranges). Reference ranges may vary among labs and hospitals. PSA levels vary from person to person and generally increase with age. Because of this variation, there is no single PSA value that is considered normal for everyone. Instead, PSA reference ranges are used to describe whether your PSA levels are considered low or high (elevated). Common reference ranges are:  Low: 0-2.5 ng/mL.  Slightly to moderately elevated: 2.6-10.0 ng/mL.  Moderately elevated: 10.0-19.9 ng/mL.  Significantly elevated: 20 ng/mL or greater. Sometimes, the test results may report that a condition is present when it is not present (false-positive result). What do the results mean? A test result that is higher than 4 ng/mL may mean that you are at an increased risk for prostate cancer. However, a PSA test by itself is not enough to diagnose prostate cancer. High PSA levels may also be caused by the natural aging process, prostate infection, or BPH. PSA screening cannot tell you if your PSA is high due to cancer or a different cause. A prostate biopsy is the only way to diagnose prostate cancer. A risk of having the PSA test is diagnosing and treating prostate cancer that would never   have caused any symptoms or problems (overdiagnosis and overtreatment). Talk with your health care provider about what your results mean. Questions to ask your health care provider Ask your health care provider, or the department that is doing the test:  When will my results be ready?  How will I get my results?  What are my treatment options?  What other tests do I  need?  What are my next steps? Summary  The prostate-specific antigen (PSA) test is a screening test for prostate cancer.  Your health care provider may recommend that you have a PSA test starting at age 63 or that you have one earlier or later, depending on your risk factors for prostate cancer.  A test result that is higher than 4 ng/mL may mean that you are at an increased risk for prostate cancer. However, elevated levels can be caused by a number of conditions other than prostate cancer.  Talk with your health care provider about what your results mean. This information is not intended to replace advice given to you by your health care provider. Make sure you discuss any questions you have with your health care provider. Document Revised: 10/07/2019 Document Reviewed: 10/07/2019 Elsevier Patient Education  2021 Elsevier Inc.  

## 2020-06-21 NOTE — Progress Notes (Signed)
06/21/2020 9:33 AM   Edward Taylor 23-Apr-1955 [id number]  Referring provider: Claretta Fraise, MD [place],  [place] [address]  Elevated PSA  HPI: Mr Hillmer is a 65yo here for followup for elevated PSA. PSA stable at 7.4. It was 7.5 7 months ago. No worsening LUTS. IPSS 0 with QOL 0. He stopped allegra D after his last visit. He then retested his PSA at that time which was 6.    PMH: Past Medical History:  Diagnosis Date  . Allergy   . Basal cell carcinoma   . GERD (gastroesophageal reflux disease)   . Hyperlipidemia   . Hypertension     Surgical History: No past surgical history on file.  Home Medications:  Allergies as of 06/21/2020   No Known Allergies     Medication List       Accurate as of Jun 21, 2020  9:33 AM. If you have any questions, ask your nurse or doctor.        ALLEGRA-D 24 HOUR PO What changed: Another medication with the same name was removed. Continue taking this medication, and follow the directions you see here. Changed by: [name], MD   cetirizine 10 MG tablet Commonly known as: ZYRTEC Take 10 mg by mouth daily.   famotidine 40 MG tablet Commonly known as: PEPCID Take 1 tablet (40 mg total) by mouth daily.   fluticasone 50 MCG/ACT nasal spray Commonly known as: FLONASE Place 1 spray into both nostrils daily.   levofloxacin 750 MG tablet Commonly known as: Levaquin Take 1 tablet (750 mg total) by mouth daily. To be taken the morning of prostate biopsy 02/02/20   lisinopril 20 MG tablet Commonly known as: ZESTRIL Take 1 tablet (20 mg total) by mouth daily.   metoprolol succinate 100 MG 24 hr tablet Commonly known as: TOPROL-XL Take 1 tablet (100 mg total) by mouth daily. Take with or immediately following a meal.   sildenafil 20 MG tablet Commonly known as: REVATIO TAKE 2-5 PILLS AT ONCE BY MOUTH WITH EACH SEXUAL ENCOUNTER   simvastatin 40 MG tablet Commonly known as: ZOCOR TAKE 1 TABLET DAILY AT      6:00PM       Allergies: No Known Allergies  Family History: Family History  Problem Relation Age of Onset  . Cancer Mother   . Hypertension Mother   . Arthritis Father   . Diabetes Father   . Hyperlipidemia Father   . Hypertension Father   . Stroke Father   . Heart disease Maternal Grandfather   . Heart disease Paternal Grandfather     Social History:  reports that he has never smoked. He has never used smokeless tobacco. He reports current alcohol use of about 4.0 standard drinks of alcohol per week. He reports that he does not use drugs.  ROS: All other review of systems were reviewed and are negative except what is noted above in HPI  Physical Exam: BP 129/85   Pulse 78   Temp 98.2 F (36.8 C) (Oral)   Resp 16   Ht 5\' 10"  (1.778 m)   Wt 208 lb (94.3 kg)   BMI 29.84 kg/m   Constitutional:  Alert and oriented, No acute distress. HEENT: [place] AT, moist mucus membranes.  Trachea midline, no masses. Cardiovascular: No clubbing, cyanosis, or edema. Respiratory: Normal respiratory effort, no increased work of breathing. GI: Abdomen is soft, nontender, nondistended, no abdominal masses GU: No CVA tenderness.  Lymph: No cervical or inguinal lymphadenopathy.  Skin: No rashes, bruises or suspicious lesions. Neurologic: Grossly intact, no focal deficits, moving all 4 extremities. Psychiatric: Normal mood and affect.  Laboratory Data: Lab Results  Component Value Date   WBC 7.3 11/08/2019   HGB 15.6 11/08/2019   HCT 47.1 11/08/2019   MCV 92 11/08/2019   PLT 257 11/08/2019    Lab Results  Component Value Date   CREATININE 0.87 11/08/2019    No results found for: PSA  No results found for: TESTOSTERONE  No results found for: HGBA1C  Urinalysis    Component Value Date/Time   APPEARANCEUR Clear 12/23/2019 0844   GLUCOSEU Negative 12/23/2019 0844   BILIRUBINUR Negative 12/23/2019 0844   PROTEINUR Negative 12/23/2019 0844   NITRITE Negative 12/23/2019 0844    LEUKOCYTESUR Negative 12/23/2019 0844    Lab Results  Component Value Date   LABMICR Comment 12/23/2019    Pertinent Imaging:  No results found for this or any previous visit.  No results found for this or any previous visit.  No results found for this or any previous visit.  No results found for this or any previous visit.  No results found for this or any previous visit.  No results found for this or any previous visit.  No results found for this or any previous visit.  No results found for this or any previous visit.   Assessment & Plan:    1. Elevated PSA The patient and I talked about etiologies of elevated PSA.  We discussed the possible relationship between elevated PSA and prostate cancer, BPH, prostatitis, infection trauma and recent ejaculations.  We discussed prostate biopsy versus PSA surveillance and the patient wishes to proceed with surveillance.  If it remains elevated with a positive rising trend we will discuss prostate biopsy at his follow-up appointment.   No follow-ups on file.  [name], MD  [facility] [place]

## 2020-07-05 NOTE — Progress Notes (Signed)
Results sent via my chart 

## 2020-07-26 ENCOUNTER — Ambulatory Visit: Payer: BC Managed Care – PPO | Admitting: Family Medicine

## 2020-10-20 ENCOUNTER — Other Ambulatory Visit: Payer: Self-pay | Admitting: Family Medicine

## 2020-11-08 ENCOUNTER — Encounter: Payer: BC Managed Care – PPO | Admitting: Family Medicine

## 2020-11-14 ENCOUNTER — Encounter: Payer: BC Managed Care – PPO | Admitting: Family Medicine

## 2020-11-20 ENCOUNTER — Encounter: Payer: Self-pay | Admitting: Family Medicine

## 2020-11-20 ENCOUNTER — Other Ambulatory Visit: Payer: Self-pay

## 2020-11-20 ENCOUNTER — Ambulatory Visit (INDEPENDENT_AMBULATORY_CARE_PROVIDER_SITE_OTHER): Payer: BC Managed Care – PPO | Admitting: Family Medicine

## 2020-11-20 VITALS — Temp 97.6°F | Ht 70.0 in | Wt 202.6 lb

## 2020-11-20 DIAGNOSIS — H43813 Vitreous degeneration, bilateral: Secondary | ICD-10-CM | POA: Diagnosis not present

## 2020-11-20 DIAGNOSIS — E782 Mixed hyperlipidemia: Secondary | ICD-10-CM

## 2020-11-20 DIAGNOSIS — Z125 Encounter for screening for malignant neoplasm of prostate: Secondary | ICD-10-CM | POA: Diagnosis not present

## 2020-11-20 DIAGNOSIS — Z0001 Encounter for general adult medical examination with abnormal findings: Secondary | ICD-10-CM

## 2020-11-20 DIAGNOSIS — E559 Vitamin D deficiency, unspecified: Secondary | ICD-10-CM | POA: Diagnosis not present

## 2020-11-20 DIAGNOSIS — I10 Essential (primary) hypertension: Secondary | ICD-10-CM

## 2020-11-20 DIAGNOSIS — Z0000 Encounter for general adult medical examination without abnormal findings: Secondary | ICD-10-CM

## 2020-11-20 DIAGNOSIS — M79672 Pain in left foot: Secondary | ICD-10-CM

## 2020-11-20 MED ORDER — SIMVASTATIN 40 MG PO TABS: ORAL_TABLET | ORAL | 3 refills | Status: DC

## 2020-11-20 MED ORDER — LISINOPRIL 20 MG PO TABS: 20.0000 mg | ORAL_TABLET | Freq: Every day | ORAL | 3 refills | Status: DC

## 2020-11-20 MED ORDER — METOPROLOL SUCCINATE ER 100 MG PO TB24: 100.0000 mg | ORAL_TABLET | Freq: Every day | ORAL | 3 refills | Status: DC

## 2020-11-20 NOTE — Progress Notes (Signed)
Subjective:  Patient ID: Edward Taylor, male    DOB: 06-24-1955  Age: 65 y.o. MRN: [id number]  CC: Annual Exam (PVD IN BOTH EYES. FOUND OUT FIRST OF THE YEAR)   HPI Edward Taylor presents for CPE.  States that he was told his colonoscopy report was good, so he was told he didn't need colonoscopy again for 10 years. That would be April 2027.   Depression screen [facility] 2/9 11/20/2020 11/08/2019 11/05/2018  Decreased Interest 0 0 0  Down, Depressed, Hopeless 1 0 0  PHQ - 2 Score 1 0 0  Altered sleeping 1 - 0  Tired, decreased energy 0 - 0  Change in appetite 0 - 0  Feeling bad or failure about yourself  0 - 0  Trouble concentrating 0 - 0  Moving slowly or fidgety/restless 0 - 0  Suicidal thoughts 0 - 0  PHQ-9 Score 2 - 0  Difficult doing work/chores Not difficult at all - -    History Edward Taylor has a past medical history of Allergy, Basal cell carcinoma, GERD (gastroesophageal reflux disease), Hyperlipidemia, and Hypertension.   He has no past surgical history on file.   His family history includes Arthritis in his father; Cancer in his mother; Diabetes in his father; Heart disease in his maternal grandfather and paternal grandfather; Hyperlipidemia in his father; Hypertension in his father and mother; Stroke in his father.He reports that he has never smoked. He has never used smokeless tobacco. He reports current alcohol use of about 4.0 standard drinks per week. He reports that he does not use drugs.    ROS Review of Systems  Constitutional:  Negative for activity change, fatigue and unexpected weight change.  HENT:  Negative for congestion, ear pain, hearing loss, postnasal drip and trouble swallowing.   Eyes:  Negative for pain and visual disturbance.  Respiratory:  Negative for cough, chest tightness and shortness of breath.   Cardiovascular:  Negative for chest pain, palpitations and leg swelling.  Gastrointestinal:  Negative for abdominal distention, abdominal pain, blood  in stool, constipation, diarrhea, nausea and vomiting.  Endocrine: Negative for cold intolerance, heat intolerance and polydipsia.  Genitourinary:  Negative for difficulty urinating, dysuria, flank pain, frequency and urgency.  Musculoskeletal:  Positive for arthralgias (left foot pain since injury 2 years ago. CAN INTERFERE WITH USE OF HIS TREADMILL.). Negative for joint swelling.  Skin:  Negative for color change, rash and wound.  Neurological:  Negative for dizziness, syncope, speech difficulty, weakness, light-headedness, numbness and headaches.  Hematological:  Does not bruise/bleed easily.  Psychiatric/Behavioral:  Negative for confusion, decreased concentration, dysphoric mood and sleep disturbance. The patient is not nervous/anxious.    Objective:  Temp 97.6 F (36.4 C) (Temporal)   Ht $R'5\' 10"'Dp$  (1.778 m)   Wt 202 lb 9.6 oz (91.9 kg)   BMI 29.07 kg/m   BP Readings from Last 3 Encounters:  06/21/20 129/85  12/23/19 131/84  11/08/19 122/80    Wt Readings from Last 3 Encounters:  11/20/20 202 lb 9.6 oz (91.9 kg)  06/21/20 208 lb (94.3 kg)  12/23/19 195 lb (88.5 kg)     Physical Exam Constitutional:      Appearance: He is well-developed.  HENT:     Head: Normocephalic and atraumatic.  Eyes:     Pupils: Pupils are equal, round, and reactive to light.  Neck:     Thyroid: No thyromegaly.     Trachea: No tracheal deviation.  Cardiovascular:     Rate and Rhythm: Normal  rate and regular rhythm.     Heart sounds: Normal heart sounds. No murmur heard.   No friction rub. No gallop.  Pulmonary:     Breath sounds: Normal breath sounds. No wheezing or rales.  Abdominal:     General: Bowel sounds are normal. There is no distension.     Palpations: Abdomen is soft. There is no mass.     Tenderness: There is no abdominal tenderness.     Hernia: There is no hernia in the left inguinal area.  Genitourinary:    Penis: Normal.      Testes: Normal.  Musculoskeletal:         General: Normal range of motion.     Cervical back: Normal range of motion.  Lymphadenopathy:     Cervical: No cervical adenopathy.  Skin:    General: Skin is warm and dry.  Neurological:     Mental Status: He is alert and oriented to person, place, and time.      Assessment & Plan:   Edward Taylor was seen today for annual exam.  Diagnoses and all orders for this visit:  Well adult exam -     CBC with Differential/Platelet -     CMP14+EGFR -     Lipid panel -     Urinalysis -     PSA, total and free  Essential hypertension -     metoprolol succinate (TOPROL-XL) 100 MG 24 hr tablet; Take 1 tablet (100 mg total) by mouth daily. Take with or immediately following a meal. -     lisinopril (ZESTRIL) 20 MG tablet; Take 1 tablet (20 mg total) by mouth daily. -     CBC with Differential/Platelet -     CMP14+EGFR -     Lipid panel -     Urinalysis -     PSA, total and free  Mixed hyperlipidemia -     simvastatin (ZOCOR) 40 MG tablet; TAKE 1 TABLET DAILY AT     6:00PM -     CBC with Differential/Platelet -     CMP14+EGFR -     Lipid panel -     Urinalysis -     PSA, total and free  PVD (posterior vitreous detachment), bilateral  Vitamin D deficiency -     VITAMIN D 25 Hydroxy (Vit-D Deficiency, Fractures)  Screening for prostate cancer -     PSA, total and free  Left foot pain -     DG Foot Complete Left; Future      I have discontinued Edward Taylor "Edward Taylor"'s levofloxacin. I am also having him maintain his cetirizine, fluticasone, sildenafil, Fexofenadine-Pseudoephedrine (ALLEGRA-D 24 HOUR PO), famotidine, metoprolol succinate, lisinopril, and simvastatin.  Allergies as of 11/20/2020   No Known Allergies      Medication List        Accurate as of November 20, 2020  1:52 PM. If you have any questions, ask your nurse or doctor.          STOP taking these medications    levofloxacin 750 MG tablet Commonly known as: Levaquin Stopped by: Mechele Claude, MD        TAKE these medications    ALLEGRA-D 24 HOUR PO   cetirizine 10 MG tablet Commonly known as: ZYRTEC Take 10 mg by mouth daily.   famotidine 40 MG tablet Commonly known as: PEPCID TAKE 1 TABLET DAILY   fluticasone 50 MCG/ACT nasal spray Commonly known as: FLONASE Place 1 spray into both nostrils daily.  lisinopril 20 MG tablet Commonly known as: ZESTRIL Take 1 tablet (20 mg total) by mouth daily.   metoprolol succinate 100 MG 24 hr tablet Commonly known as: TOPROL-XL Take 1 tablet (100 mg total) by mouth daily. Take with or immediately following a meal.   sildenafil 20 MG tablet Commonly known as: REVATIO TAKE 2-5 PILLS AT ONCE BY MOUTH WITH EACH SEXUAL ENCOUNTER   simvastatin 40 MG tablet Commonly known as: ZOCOR TAKE 1 TABLET DAILY AT     6:00PM         Follow-up: No follow-ups on file.  Claretta Fraise, M.D.

## 2020-11-21 ENCOUNTER — Other Ambulatory Visit: Payer: Self-pay | Admitting: Family Medicine

## 2020-11-21 DIAGNOSIS — R972 Elevated prostate specific antigen [PSA]: Secondary | ICD-10-CM

## 2020-11-21 LAB — CBC WITH DIFFERENTIAL/PLATELET
Basophils Absolute: 0.1 10*3/uL (ref 0.0–0.2)
Basos: 1 %
EOS (ABSOLUTE): 0.2 10*3/uL (ref 0.0–0.4)
Eos: 2 %
Hematocrit: 46.8 % (ref 37.5–51.0)
Hemoglobin: 15.9 g/dL (ref 13.0–17.7)
Immature Grans (Abs): 0.1 10*3/uL (ref 0.0–0.1)
Immature Granulocytes: 1 %
Lymphocytes Absolute: 1.8 10*3/uL (ref 0.7–3.1)
Lymphs: 22 %
MCH: 30.8 pg (ref 26.6–33.0)
MCHC: 34 g/dL (ref 31.5–35.7)
MCV: 91 fL (ref 79–97)
Monocytes Absolute: 0.6 10*3/uL (ref 0.1–0.9)
Monocytes: 8 %
Neutrophils Absolute: 5.5 10*3/uL (ref 1.4–7.0)
Neutrophils: 66 %
Platelets: 213 10*3/uL (ref 150–450)
RBC: 5.16 x10E6/uL (ref 4.14–5.80)
RDW: 12.6 % (ref 11.6–15.4)
WBC: 8.3 10*3/uL (ref 3.4–10.8)

## 2020-11-21 LAB — URINALYSIS
Bilirubin, UA: NEGATIVE
Glucose, UA: NEGATIVE
Ketones, UA: NEGATIVE
Leukocytes,UA: NEGATIVE
Nitrite, UA: NEGATIVE
Protein,UA: NEGATIVE
RBC, UA: NEGATIVE
Specific Gravity, UA: >1.03 — ABNORMAL HIGH (ref 1.005–1.030)
Urobilinogen, Ur: 0.2 mg/dL (ref 0.2–1.0)
pH, UA: 5.5 (ref 5.0–7.5)

## 2020-11-21 LAB — LIPID PANEL
Chol/HDL Ratio: 3.8 ratio (ref 0.0–5.0)
Cholesterol, Total: 161 mg/dL (ref 100–199)
HDL: 42 mg/dL
LDL Chol Calc (NIH): 92 mg/dL (ref 0–99)
Triglycerides: 155 mg/dL — ABNORMAL HIGH (ref 0–149)
VLDL Cholesterol Cal: 27 mg/dL (ref 5–40)

## 2020-11-21 LAB — CMP14+EGFR
ALT: 34 IU/L (ref 0–44)
AST: 21 IU/L (ref 0–40)
Albumin/Globulin Ratio: 2.2 (ref 1.2–2.2)
Albumin: 5 g/dL — ABNORMAL HIGH (ref 3.8–4.8)
Alkaline Phosphatase: 58 IU/L (ref 44–121)
BUN/Creatinine Ratio: 21 (ref 10–24)
BUN: 18 mg/dL (ref 8–27)
Bilirubin Total: 0.5 mg/dL (ref 0.0–1.2)
CO2: 23 mmol/L (ref 20–29)
Calcium: 9.6 mg/dL (ref 8.6–10.2)
Chloride: 102 mmol/L (ref 96–106)
Creatinine, Ser: 0.87 mg/dL (ref 0.76–1.27)
Globulin, Total: 2.3 g/dL (ref 1.5–4.5)
Glucose: 97 mg/dL (ref 70–99)
Potassium: 4.5 mmol/L (ref 3.5–5.2)
Sodium: 142 mmol/L (ref 134–144)
Total Protein: 7.3 g/dL (ref 6.0–8.5)
eGFR: 98 mL/min/1.73

## 2020-11-21 LAB — PSA, TOTAL AND FREE
PSA, Free Pct: 6.2 %
PSA, Free: 0.54 ng/mL
Prostate Specific Ag, Serum: 8.7 ng/mL — ABNORMAL HIGH (ref 0.0–4.0)

## 2020-11-21 LAB — VITAMIN D 25 HYDROXY (VIT D DEFICIENCY, FRACTURES): Vit D, 25-Hydroxy: 35.6 ng/mL (ref 30.0–100.0)

## 2020-11-21 NOTE — Progress Notes (Signed)
Patient's PSA has gone up somewhat.  It is now 8.7.  When it was last checked in May it was 7.8.

## 2020-11-22 ENCOUNTER — Other Ambulatory Visit: Payer: Self-pay | Admitting: Family Medicine

## 2020-11-22 DIAGNOSIS — E782 Mixed hyperlipidemia: Secondary | ICD-10-CM

## 2020-11-22 DIAGNOSIS — I10 Essential (primary) hypertension: Secondary | ICD-10-CM

## 2020-11-29 ENCOUNTER — Ambulatory Visit (HOSPITAL_COMMUNITY): Payer: BC Managed Care – PPO

## 2021-01-03 ENCOUNTER — Other Ambulatory Visit: Payer: Self-pay | Admitting: Family Medicine

## 2021-01-03 DIAGNOSIS — I10 Essential (primary) hypertension: Secondary | ICD-10-CM

## 2021-01-04 ENCOUNTER — Telehealth: Payer: Self-pay | Admitting: *Deleted

## 2021-01-04 ENCOUNTER — Other Ambulatory Visit: Payer: Self-pay | Admitting: Family Medicine

## 2021-01-04 DIAGNOSIS — M79672 Pain in left foot: Secondary | ICD-10-CM

## 2021-01-04 NOTE — Telephone Encounter (Signed)
TC out to pt, received request from [facility] for refill on Metoprolol, but this was just sent in at visit to [facility] wanted to make sure pt wanted this to go to mail order pharmacy. He wants to switch back to local pharmacy d/t price he was paying w/ mail order and having to call to get refills.  Also asked about referral to Podiatry that they talked about during visit.  Reason for Referral: Left foot pain Referral discussed with patient: yes Best contact number of patient for referral team: [phone]   Has patient been seen by a specialist for this issue before: no Patient provider preference for referral: not discussed during call Patient location preference for referral: not discussed during call

## 2021-01-04 NOTE — Telephone Encounter (Signed)
LMOVM that referral has been placed and our referral coordinator, Loma Sousa will give him a call or if a [facility] facility they may give him a call first.

## 2021-01-04 NOTE — Telephone Encounter (Signed)
Referral placed, as requested WS 

## 2021-02-09 ENCOUNTER — Ambulatory Visit (HOSPITAL_COMMUNITY)
Admission: RE | Admit: 2021-02-09 | Discharge: 2021-02-09 | Disposition: A | Discharge status: Home / Self Care | Payer: BC Managed Care – PPO | Source: Ambulatory Visit | Attending: Family Medicine | Admitting: Family Medicine

## 2021-02-09 ENCOUNTER — Other Ambulatory Visit: Payer: Self-pay

## 2021-02-09 DIAGNOSIS — R972 Elevated prostate specific antigen [PSA]: Secondary | ICD-10-CM | POA: Insufficient documentation

## 2021-02-09 DIAGNOSIS — K5730 Diverticulosis of large intestine without perforation or abscess without bleeding: Secondary | ICD-10-CM | POA: Diagnosis not present

## 2021-02-09 DIAGNOSIS — R590 Localized enlarged lymph nodes: Secondary | ICD-10-CM | POA: Diagnosis not present

## 2021-02-09 MED ORDER — GADOBUTROL 1 MMOL/ML IV SOLN
10.0000 mL | Freq: Once | INTRAVENOUS | Status: AC | PRN
  Administered 2021-02-09: 10 mL via INTRAVENOUS

## 2021-02-14 ENCOUNTER — Other Ambulatory Visit: Payer: BC Managed Care – PPO

## 2021-02-14 ENCOUNTER — Other Ambulatory Visit: Payer: Self-pay

## 2021-02-14 DIAGNOSIS — R972 Elevated prostate specific antigen [PSA]: Secondary | ICD-10-CM | POA: Diagnosis not present

## 2021-02-15 LAB — PSA, TOTAL AND FREE
PSA, Free Pct: 6.2 %
PSA, Free: 0.61 ng/mL
Prostate Specific Ag, Serum: 9.8 ng/mL — ABNORMAL HIGH (ref 0.0–4.0)

## 2021-02-21 ENCOUNTER — Ambulatory Visit (INDEPENDENT_AMBULATORY_CARE_PROVIDER_SITE_OTHER): Payer: BC Managed Care – PPO | Admitting: Urology

## 2021-02-21 ENCOUNTER — Other Ambulatory Visit: Payer: Self-pay

## 2021-02-21 ENCOUNTER — Encounter: Payer: Self-pay | Admitting: Urology

## 2021-02-21 VITALS — BP 143/81 | HR 71 | Ht 70.5 in | Wt 200.0 lb

## 2021-02-21 DIAGNOSIS — R972 Elevated prostate specific antigen [PSA]: Secondary | ICD-10-CM | POA: Diagnosis not present

## 2021-02-21 LAB — URINALYSIS, ROUTINE W REFLEX MICROSCOPIC
Bilirubin, UA: NEGATIVE
Glucose, UA: NEGATIVE
Ketones, UA: NEGATIVE
Leukocytes,UA: NEGATIVE
Nitrite, UA: NEGATIVE
Protein,UA: NEGATIVE
RBC, UA: NEGATIVE
Specific Gravity, UA: 1.025 (ref 1.005–1.030)
Urobilinogen, Ur: 0.2 mg/dL (ref 0.2–1.0)
pH, UA: 5.5 (ref 5.0–7.5)

## 2021-02-21 MED ORDER — LEVOFLOXACIN 750 MG PO TABS: 750.0000 mg | ORAL_TABLET | Freq: Once | ORAL | 0 refills | Status: AC

## 2021-02-21 MED ORDER — DIAZEPAM 10 MG PO TABS: 10.0000 mg | ORAL_TABLET | Freq: Once | ORAL | 0 refills | Status: AC

## 2021-02-21 NOTE — Patient Instructions (Addendum)
° °  Appointment Time:  arrive 12:15 Appointment Date: 02/28/2021  Location: [facility] Radiology Department   Prostate Biopsy Instructions  Stop all aspirin or blood thinners (aspirin, plavix, coumadin, warfarin, motrin, ibuprofen, advil, aleve, naproxen, naprosyn) for 7 days prior to the procedure.  If you have any questions about stopping these medications, please contact your primary care physician or cardiologist.  Having a light meal prior to the procedure is recommended.  If you are diabetic or have low blood sugar please bring a small snack or glucose tablet.  A Fleets enema is needed to be purchased over the counter at a local pharmacy and used 2 hours before you scheduled appointment.  This can be purchased over the counter at any pharmacy.  Antibiotics will be administered in the clinic at the time of the procedure and 1 tablet has been sent to your pharmacy. Please take the antibiotic as prescribed.    Please bring someone with you to the procedure to drive you home if you are given a valium to take prior to your procedure.   If you have any questions or concerns, please feel free to call the office at [phone] or send a Mychart message.    Thank you, [facility]

## 2021-02-21 NOTE — Progress Notes (Signed)
Urological Symptom Review  Patient is experiencing the following symptoms: none   Review of Systems  Gastrointestinal (upper)  : Negative for upper GI symptoms  Gastrointestinal (lower) : Negative for lower GI symptoms  Constitutional : Negative for symptoms  Skin: Negative for skin symptoms  Eyes: Posterior Vitreons Detachment  Ear/Nose/Throat : Sinus problems  Hematologic/Lymphatic: Negative for Hematologic/Lymphatic symptoms  Cardiovascular : Negative for cardiovascular symptoms  Respiratory : Negative for respiratory symptoms  Endocrine: Negative for endocrine symptoms  Musculoskeletal: Negative for musculoskeletal symptoms  Neurological: Negative for neurological symptoms  Psychologic: Anxiety

## 2021-02-21 NOTE — Progress Notes (Signed)
02/21/2021 8:46 AM   Edward Taylor 05-16-1955 [id number]  Referring provider: Claretta Fraise, MD [name],  [place] [address]  Followup elevated PSA   HPI: Edward Taylor is a 66yo here for followup for elevated PSA. His PSA increased to 9.8 from 8.7 six months ago. He underwent prostate MRI 02/09/2021 which showed  large PIRADs 5 lesion involving the left base and mid gland. No significant LUTS.     PMH: Past Medical History:  Diagnosis Date   Allergy    Basal cell carcinoma    GERD (gastroesophageal reflux disease)    Hyperlipidemia    Hypertension     Surgical History: No past surgical history on file.  Home Medications:  Allergies as of 02/21/2021   No Known Allergies      Medication List        Accurate as of February 21, 2021  8:46 AM. If you have any questions, ask your nurse or doctor.          ALLEGRA-D 24 HOUR PO   cetirizine 10 MG tablet Commonly known as: ZYRTEC Take 10 mg by mouth daily.   famotidine 40 MG tablet Commonly known as: PEPCID TAKE 1 TABLET DAILY   fluticasone 50 MCG/ACT nasal spray Commonly known as: FLONASE Place 1 spray into both nostrils daily.   lisinopril 20 MG tablet Commonly known as: ZESTRIL TAKE 1 TABLET DAILY   metoprolol succinate 100 MG 24 hr tablet Commonly known as: TOPROL-XL Take 1 tablet (100 mg total) by mouth daily. Take with or immediately following a meal.   sildenafil 20 MG tablet Commonly known as: REVATIO TAKE 2-5 PILLS AT ONCE BY MOUTH WITH EACH SEXUAL ENCOUNTER   simvastatin 40 MG tablet Commonly known as: ZOCOR TAKE 1 TABLET DAILY AT     6:00PM   VITAMIN D3 COMPLETE PO Take by mouth.   zinc gluconate 50 MG tablet Take 50 mg by mouth daily.        Allergies: No Known Allergies  Family History: Family History  Problem Relation Age of Onset   Cancer Mother    Hypertension Mother    Arthritis Father    Diabetes Father    Hyperlipidemia Father    Hypertension Father     Stroke Father    Heart disease Maternal Grandfather    Heart disease Paternal Grandfather     Social History:  reports that he has never smoked. He has never used smokeless tobacco. He reports current alcohol use of about 4.0 standard drinks per week. He reports that he does not use drugs.  ROS: All other review of systems were reviewed and are negative except what is noted above in HPI  Physical Exam: BP (!) 143/81    Pulse 71    Ht 5' 10.5" (1.791 m)    Wt 200 lb (90.7 kg)    BMI 28.29 kg/m   Constitutional:  Alert and oriented, No acute distress. HEENT: [place] AT, moist mucus membranes.  Trachea midline, no masses. Cardiovascular: No clubbing, cyanosis, or edema. Respiratory: Normal respiratory effort, no increased work of breathing. GI: Abdomen is soft, nontender, nondistended, no abdominal masses GU: No CVA tenderness.  Lymph: No cervical or inguinal lymphadenopathy. Skin: No rashes, bruises or suspicious lesions. Neurologic: Grossly intact, no focal deficits, moving all 4 extremities. Psychiatric: Normal mood and affect.  Laboratory Data: Lab Results  Component Value Date   WBC 8.3 11/20/2020   HGB 15.9 11/20/2020   HCT 46.8 11/20/2020  MCV 91 11/20/2020   PLT 213 11/20/2020    Lab Results  Component Value Date   CREATININE 0.87 11/20/2020    No results found for: PSA  No results found for: TESTOSTERONE  No results found for: HGBA1C  Urinalysis    Component Value Date/Time   APPEARANCEUR Clear 11/20/2020 1444   GLUCOSEU Negative 11/20/2020 1444   BILIRUBINUR Negative 11/20/2020 1444   PROTEINUR Negative 11/20/2020 1444   NITRITE Negative 11/20/2020 1444   LEUKOCYTESUR Negative 11/20/2020 1444    Lab Results  Component Value Date   LABMICR Comment 06/21/2020    Pertinent Imaging: Prostate MRI 02/09/2021: Images reviewed and discussed with the patient No results found for this or any previous visit.  No results found for this or any previous  visit.  No results found for this or any previous visit.  No results found for this or any previous visit.  No results found for this or any previous visit.  No results found for this or any previous visit.  No results found for this or any previous visit.  No results found for this or any previous visit.   Assessment & Plan:    1. Elevated PSA The patient and I talked about etiologies of elevated PSA.  We discussed the possible relationship between elevated PSA, prostate cancer, BPH, prostatitis, and UTI.   Conservative treatment of elevated PSA with watchful waiting was discussed with the patient.  All questions were answered.        All of the risks and benefits along with alternatives to prostate biopsy were discussed with the patient.  The patient gave fully informed consent to proceed with a transrectal ultrasound guided biopsy of the prostate for the evaluation of their evated PSA.  Prostate biopsy instructions and antibiotics were given to the patient.  - Urinalysis, Routine w reflex microscopic   No follow-ups on file.  [name], MD  [facility] [place]

## 2021-02-28 ENCOUNTER — Encounter: Payer: Self-pay | Admitting: Urology

## 2021-02-28 ENCOUNTER — Encounter (HOSPITAL_COMMUNITY): Payer: Self-pay

## 2021-02-28 ENCOUNTER — Ambulatory Visit (HOSPITAL_BASED_OUTPATIENT_CLINIC_OR_DEPARTMENT_OTHER): Payer: BC Managed Care – PPO | Admitting: Urology

## 2021-02-28 ENCOUNTER — Other Ambulatory Visit: Payer: Self-pay | Admitting: Urology

## 2021-02-28 ENCOUNTER — Other Ambulatory Visit: Payer: Self-pay

## 2021-02-28 ENCOUNTER — Ambulatory Visit (HOSPITAL_COMMUNITY)
Admission: RE | Admit: 2021-02-28 | Discharge: 2021-02-28 | Disposition: A | Discharge status: Home / Self Care | Payer: BC Managed Care – PPO | Source: Ambulatory Visit | Attending: Urology | Admitting: Urology

## 2021-02-28 DIAGNOSIS — R972 Elevated prostate specific antigen [PSA]: Secondary | ICD-10-CM

## 2021-02-28 DIAGNOSIS — C61 Malignant neoplasm of prostate: Secondary | ICD-10-CM

## 2021-02-28 MED ORDER — GENTAMICIN SULFATE 40 MG/ML IJ SOLN: 80.0000 mg | Freq: Once | INTRAMUSCULAR | Status: AC

## 2021-02-28 MED ORDER — GENTAMICIN SULFATE 40 MG/ML IJ SOLN
INTRAMUSCULAR | Status: AC
  Administered 2021-02-28: 13:00:00 80 mg via INTRAMUSCULAR
  Filled 2021-02-28: qty 2

## 2021-02-28 MED ORDER — LIDOCAINE HCL (PF) 2 % IJ SOLN
INTRAMUSCULAR | Status: AC
  Administered 2021-02-28: 13:00:00 10 mL
  Filled 2021-02-28: qty 10

## 2021-02-28 MED ORDER — LIDOCAINE HCL (PF) 2 % IJ SOLN: 10.0000 mL | Freq: Once | INTRAMUSCULAR | Status: AC

## 2021-02-28 NOTE — Patient Instructions (Signed)
Transrectal Ultrasound-Guided Prostate Biopsy, Care After What can I expect after the procedure? After the procedure, it is common to have: Pain and discomfort near your butt (rectum), especially while sitting. Pink-colored pee (urine). This is due to small amounts of blood in your pee. A burning feeling while peeing. Blood in your poop (stool). Bleeding from your butt. Blood in your semen. Follow these instructions at home: Medicines Take over-the-counter and prescription medicines only as told by your doctor. If you were given a sedative during your procedure, do not drive or use machines until your doctor says that it is safe. A sedative is a medicine that helps you relax. If you were prescribed an antibiotic medicine, take it as told by your doctor. Do not stop taking it even if you start to feel better. Activity  Return to your normal activities when your doctor says that it is safe. Ask your doctor when it is okay for you to have sex. You may have to avoid lifting. Ask your doctor how much you can safely lift. General instructions  Drink enough water to keep your pee pale yellow. Watch your pee, poop, and semen for new bleeding or bleeding that gets worse. Keep all follow-up visits. Contact a doctor if: You have any of these: Blood clots in your pee or poop. Blood in your pee more than 2 weeks after the procedure. Blood in your semen more than 2 months after the procedure. New or worse bleeding in your pee, poop, or semen. Very bad belly pain. Your pee smells bad or unusual. You have trouble peeing. Your lower belly feels firm. You have problems getting an erection. You feel like you may vomit (are nauseous), or you vomit. Get help right away if: You have a fever or chills. You have bright red pee. You have very bad pain that does not get better with medicine. You cannot pee. Summary After this procedure, it is common to have pain and discomfort near your butt,  especially while sitting. You may have blood in your pee and poop. It is common to have blood in your semen. Get help right away if you have a fever or chills. This information is not intended to replace advice given to you by your health care provider. Make sure you discuss any questions you have with your health care provider. Document Revised: 07/17/2020 Document Reviewed: 07/17/2020 Elsevier Patient Education  [place].

## 2021-02-28 NOTE — Progress Notes (Signed)
PT tolerated prostate biopsy procedure and antibiotic injection well today. Labs obtained and sent for pathology. PT ambulatory at discharge with no acute distress noted and verbalized understanding of discharge instructions. PT to follow up with urologist as scheduled on 03/07/21 @3 :45pm.

## 2021-02-28 NOTE — Progress Notes (Signed)
Prostate Biopsy Procedure   Informed consent was obtained after discussing risks/benefits of the procedure.  A time out was performed to ensure correct patient identity.  Pre-Procedure: - Last PSA Level: No results found for: PSA - Gentamicin given prophylactically - Levaquin 500 mg administered PO -Transrectal Ultrasound performed revealing a 27.8 gm prostate -No significant hypoechoic or median lobe noted  Procedure: - Prostate block performed using 10 cc 1% lidocaine and biopsies taken from sextant areas, a total of 12 under ultrasound guidance.  Post-Procedure: - Patient tolerated the procedure well - He was counseled to seek immediate medical attention if experiences any severe pain, significant bleeding, or fevers - Return in one week to discuss biopsy results

## 2021-03-07 ENCOUNTER — Encounter: Payer: Self-pay | Admitting: Urology

## 2021-03-07 ENCOUNTER — Other Ambulatory Visit: Payer: Self-pay

## 2021-03-07 ENCOUNTER — Ambulatory Visit (INDEPENDENT_AMBULATORY_CARE_PROVIDER_SITE_OTHER): Payer: BC Managed Care – PPO | Admitting: Urology

## 2021-03-07 VITALS — BP 164/77 | HR 71

## 2021-03-07 DIAGNOSIS — C61 Malignant neoplasm of prostate: Secondary | ICD-10-CM | POA: Diagnosis not present

## 2021-03-07 DIAGNOSIS — R972 Elevated prostate specific antigen [PSA]: Secondary | ICD-10-CM

## 2021-03-07 NOTE — Progress Notes (Signed)
03/07/2021 4:07 PM   [name] 1955-09-04 [id number]  Referring provider: Claretta Fraise, MD [place],  [place] [address]  Followup prostate biopsy   HPI: Mr Dilone is a 66yo here for followup after prostate biopsy. Biopsy revealed Gleason 4+3=7 in 10/12 cores. PSA 9.7. He has mild LUTS. He has no issues with erections. No other complaints today.    PMH: Past Medical History:  Diagnosis Date   Allergy    Basal cell carcinoma    GERD (gastroesophageal reflux disease)    Hyperlipidemia    Hypertension     Surgical History: History reviewed. No pertinent surgical history.  Home Medications:  Allergies as of 03/07/2021   No Known Allergies      Medication List        Accurate as of March 07, 2021  4:07 PM. If you have any questions, ask your nurse or doctor.          ALLEGRA-D 24 HOUR PO   cetirizine 10 MG tablet Commonly known as: ZYRTEC Take 10 mg by mouth daily.   famotidine 40 MG tablet Commonly known as: PEPCID TAKE 1 TABLET DAILY   fluticasone 50 MCG/ACT nasal spray Commonly known as: FLONASE Place 1 spray into both nostrils daily.   lisinopril 20 MG tablet Commonly known as: ZESTRIL TAKE 1 TABLET DAILY   metoprolol succinate 100 MG 24 hr tablet Commonly known as: TOPROL-XL Take 1 tablet (100 mg total) by mouth daily. Take with or immediately following a meal.   sildenafil 20 MG tablet Commonly known as: REVATIO TAKE 2-5 PILLS AT ONCE BY MOUTH WITH EACH SEXUAL ENCOUNTER   simvastatin 40 MG tablet Commonly known as: ZOCOR TAKE 1 TABLET DAILY AT     6:00PM   VITAMIN D3 COMPLETE PO Take by mouth.   zinc gluconate 50 MG tablet Take 50 mg by mouth daily.        Allergies: No Known Allergies  Family History: Family History  Problem Relation Age of Onset   Cancer Mother    Hypertension Mother    Arthritis Father    Diabetes Father    Hyperlipidemia Father    Hypertension Father    Stroke Father    Heart  disease Maternal Grandfather    Heart disease Paternal Grandfather     Social History:  reports that he has never smoked. He has never used smokeless tobacco. He reports current alcohol use of about 4.0 standard drinks per week. He reports that he does not use drugs.  ROS: All other review of systems were reviewed and are negative except what is noted above in HPI  Physical Exam: BP (!) 164/77    Pulse 71   Constitutional:  Alert and oriented, No acute distress. HEENT: [place] AT, moist mucus membranes.  Trachea midline, no masses. Cardiovascular: No clubbing, cyanosis, or edema. Respiratory: Normal respiratory effort, no increased work of breathing. GI: Abdomen is soft, nontender, nondistended, no abdominal masses GU: No CVA tenderness.  Lymph: No cervical or inguinal lymphadenopathy. Skin: No rashes, bruises or suspicious lesions. Neurologic: Grossly intact, no focal deficits, moving all 4 extremities. Psychiatric: Normal mood and affect.  Laboratory Data: Lab Results  Component Value Date   WBC 8.3 11/20/2020   HGB 15.9 11/20/2020   HCT 46.8 11/20/2020   MCV 91 11/20/2020   PLT 213 11/20/2020    Lab Results  Component Value Date   CREATININE 0.87 11/20/2020    No results found for: PSA  No  results found for: TESTOSTERONE  No results found for: HGBA1C  Urinalysis    Component Value Date/Time   APPEARANCEUR Clear 02/21/2021 0905   GLUCOSEU Negative 02/21/2021 0905   BILIRUBINUR Negative 02/21/2021 0905   PROTEINUR Negative 02/21/2021 0905   NITRITE Negative 02/21/2021 0905   LEUKOCYTESUR Negative 02/21/2021 0905    Lab Results  Component Value Date   LABMICR Comment 02/21/2021    Pertinent Imaging:  No results found for this or any previous visit.  No results found for this or any previous visit.  No results found for this or any previous visit.  No results found for this or any previous visit.  No results found for this or any previous visit.  No  results found for this or any previous visit.  No results found for this or any previous visit.  No results found for this or any previous visit.   Assessment & Plan:    1. Prostate cancer ([place]) I discussed the natural history of unfavorable intermediate risk prostate cancer with the patient and the various treatment options including active surveillance, RALP, IMRT, brachytherapy, cryotherapy, HIFU and ADT. After discussing the options the patient elects for radiation therapy. I will refer him to Dr. Tammi Klippel for consideration of radiation therapy.     Return in about 4 weeks (around 04/04/2021).  [name], MD  [facility] [place]

## 2021-03-07 NOTE — Patient Instructions (Signed)
Prostate Cancer The prostate is a small gland that produces fluid that makes up semen (seminal fluid). It is located below the bladder in men, in front of the rectum. Prostate cancer is the abnormal growth of cells in the prostate gland. What are the causes? The exact cause of this condition is not known. What increases the risk? You are more likely to develop this condition if: You are 64 years of age or older. You have a family history of prostate cancer. You have a family history of breast and ovarian cancer. You have genes that are passed from parent to child (inherited), such as BRCA1 and BRCA2. You have Lynch syndrome. African American men and men of African descent are diagnosed with prostate cancer at higher rates than other men. The reasons for this are not well understood and are likely due to a combination of genetic and environmental factors. What are the signs or symptoms? Symptoms of this condition include: Problems with urination. This may include: A weak or interrupted flow of urine. Trouble starting or stopping urination. Trouble emptying the bladder all the way. The need to urinate more often, especially at night. Blood in urine or semen. Persistent pain or discomfort in the lower back, lower abdomen, or hips. Trouble getting an erection. Weakness or numbness in the legs or feet. How is this diagnosed? This condition can be diagnosed with: A digital rectal exam. For this exam, a health care provider inserts a gloved finger into the rectum to feel the prostate gland. A blood test called a prostate-specific antigen (PSA) test. A procedure in which a sample of tissue is taken from the prostate and checked under a microscope (prostate biopsy). An imaging test called transrectal ultrasonography. Once the condition is diagnosed, tests will be done to determine how far the cancer has spread. This is called staging the cancer. Staging may involve imaging tests, such as a bone  scan, CT scan, PET scan, or MRI. Stages of prostate cancer The stages of prostate cancer are as follows: Stage 1 (I). At this stage, the cancer is found in the prostate only. The cancer is not visible on imaging tests, and it is usually found by accident, such as during prostate surgery. Stage 2 (II). At this stage, the cancer is more advanced than it is in stage 1, but the cancer has not spread outside the prostate. Stage 3 (III). At this stage, the cancer has spread beyond the outer layer of the prostate to nearby tissues. The cancer may be found in the seminal vesicles, which are near the bladder and the prostate. Stage 4 (IV). At this stage, the cancer has spread to other parts of the body, such as the lymph nodes, bones, bladder, rectum, liver, or lungs. Prostate cancer grading Prostate cancer is also graded according to how the cancer cells look under a microscope. This is called the Gleason score and the total score can range from 6-10, indicating how likely it is that the cancer will spread (metastasize) to other parts of the body. The higher the score, the greater the likelihood that the cancer will spread. Gleason 6 or lower: This indicates that the cancer cells look similar to normal prostate cells (well differentiated). Gleason 7: This indicates that the cancer cells look somewhat similar to normal prostate cells (moderately differentiated). Gleason 8, 9, or 10: This indicates that the cancer cells look very different than normal prostate cells (poorly differentiated). How is this treated? Treatment for this condition depends on several factors,  including the stage of the cancer, your age, personal preferences, and your overall health. Talk with your health care provider about treatment options that are recommended for you. Common treatments include: °Observation for early stage prostate cancer (active surveillance). This involves having exams, blood tests, and in some cases, more biopsies.  For some men, this is the only treatment needed. °Surgery. Types of surgeries include: °Open surgery (radical prostatectomy). In this surgery, a larger incision is made to remove the prostate. °A laparoscopic radical prostatectomy. This is a surgery to remove the prostate and lymph nodes through several small incisions. It is often referred to as a minimally invasive surgery. °A robotic radical prostatectomy. This is laparoscopic surgery to remove the prostate and lymph nodes with the help of robotic arms that are controlled by the surgeon. °Cryoablation. This is surgery to freeze and destroy cancer cells. °Radiation treatment. Types of radiation treatment include: °External beam radiation. This type aims beams of radiation from outside the body at the prostate to destroy cancerous cells. °Brachytherapy. This type uses radioactive needles, seeds, wires, or tubes that are implanted into the prostate gland. Like external beam radiation, brachytherapy destroys cancerous cells. An advantage is that this type of radiation limits the damage to surrounding tissue and has fewer side effects. °Chemotherapy. This treatment kills cancer cells or stops them from multiplying. It kills both cancer cells and normal cells. °Targeted therapy. This treatment uses medicines to kill cancer cells without damaging normal cells. °Hormone treatment. This treatment involves taking medicines that act on testosterone, one of the male hormones, by: °Stopping your body from producing testosterone. °Blocking testosterone from reaching cancer cells. °Follow these instructions at home: °Lifestyle °Do not use any products that contain nicotine or tobacco. These products include cigarettes, chewing tobacco, and vaping devices, such as e-cigarettes. If you need help quitting, ask your health care provider. °Eat a healthy diet. To do this: °Eat foods that are high in fiber. These include beans, whole grains, and fresh fruits and vegetables. °Limit  foods that are high in fat and sugar. These include fried or sweet foods. °Treatment for prostate cancer may affect sexual function. If you have a partner, continue to have intimate moments. This may include touching, holding, hugging, and caressing your partner. °Get plenty of sleep. °Consider joining a support group for men who have prostate cancer. Meeting with a support group may help you learn to manage the stress of having cancer. °General instructions °Take over-the-counter and prescription medicines only as told by your health care provider. °If you have to go to the hospital, notify your cancer specialist (oncologist). °Keep all follow-up visits. This is important. °Where to find more information °American Cancer Society: www.cancer.org °American Society of Clinical Oncology: www.cancer.net °National Cancer Institute: www.cancer.gov °Contact a health care provider if: °You have new or increasing trouble urinating. °You have new or increasing blood in your urine. °You have new or increasing pain in your hips, back, or chest. °Get help right away if: °You have weakness or numbness in your legs. °You cannot control urination or your bowel movements (incontinence). °You have chills or a fever. °Summary °The prostate is a small gland that is involved in the production of semen. It is located below a man's bladder, in front of the rectum. °Prostate cancer is the abnormal growth of cells in the prostate gland. °Treatment for this condition depends on the stage of the cancer, your age, personal preferences, and your overall health. Talk with your health care provider about treatment   options that are recommended for you. °Consider joining a support group for men who have prostate cancer. Meeting with a support group may help you learn to manage the stress of having cancer. °This information is not intended to replace advice given to you by your health care provider. Make sure you discuss any questions you have with  your health care provider. °Document Revised: 04/19/2020 Document Reviewed: 04/19/2020 °Elsevier Patient Education © 2022 Elsevier Inc. ° °

## 2021-03-12 ENCOUNTER — Other Ambulatory Visit: Payer: Self-pay | Admitting: Urology

## 2021-03-12 DIAGNOSIS — C61 Malignant neoplasm of prostate: Secondary | ICD-10-CM

## 2021-03-14 ENCOUNTER — Telehealth: Payer: Self-pay | Admitting: Radiation Oncology

## 2021-03-14 NOTE — Telephone Encounter (Signed)
Patient LVM inquiring referral to Dr. Tammi Klippel from Dr. Alyson Ingles. Returned patient's call to inform him of his referral status. No answer, LVM for return call.

## 2021-03-28 NOTE — Progress Notes (Signed)
GU Location of Tumor / Histology: Prostate Ca  If Prostate Cancer, Gleason Score is (4 + 3) and PSA is (9.7 as of 03/2021)    Biopsies:  Biopsy revealed Gleason 4+3=7 in 10/12 cores. PSA 9.7. He has mild LUTS. He has no issues with erections.  Transrectal Ultrasound performed revealing a 27.8 gm prostate.  Past/Anticipated interventions by urology, if any:   Past/Anticipated interventions by medical oncology, if any:   Weight changes, if any: No  IPSS:   1 SHIM:   24  Bowel/Bladder complaints, if any:  No bowel or bladder issues  Nausea/Vomiting, if any: No  Pain issues, if any:  0/10  SAFETY ISSUES: Prior radiation? No Pacemaker/ICD?  No Possible current pregnancy?  Male Is the patient on methotrexate? No  Current Complaints / other details:

## 2021-03-30 ENCOUNTER — Other Ambulatory Visit: Payer: Self-pay

## 2021-03-30 ENCOUNTER — Encounter (HOSPITAL_COMMUNITY)
Admission: RE | Admit: 2021-03-30 | Discharge: 2021-03-30 | Disposition: A | Discharge status: Home / Self Care | Payer: BC Managed Care – PPO | Source: Ambulatory Visit | Attending: Urology | Admitting: Urology

## 2021-03-30 ENCOUNTER — Ambulatory Visit (HOSPITAL_COMMUNITY)
Admission: RE | Admit: 2021-03-30 | Discharge: 2021-03-30 | Disposition: A | Discharge status: Home / Self Care | Payer: BC Managed Care – PPO | Source: Ambulatory Visit | Attending: Urology | Admitting: Urology

## 2021-03-30 DIAGNOSIS — C61 Malignant neoplasm of prostate: Secondary | ICD-10-CM | POA: Diagnosis not present

## 2021-03-30 DIAGNOSIS — I700 Atherosclerosis of aorta: Secondary | ICD-10-CM | POA: Diagnosis not present

## 2021-03-30 DIAGNOSIS — M47817 Spondylosis without myelopathy or radiculopathy, lumbosacral region: Secondary | ICD-10-CM | POA: Diagnosis not present

## 2021-03-30 LAB — POCT I-STAT CREATININE: Creatinine, Ser: 0.8 mg/dL (ref 0.61–1.24)

## 2021-03-30 MED ORDER — IOHEXOL 300 MG/ML  SOLN
100.0000 mL | Freq: Once | INTRAMUSCULAR | Status: AC | PRN
  Administered 2021-03-30: 100 mL via INTRAVENOUS

## 2021-03-30 MED ORDER — TECHNETIUM TC 99M MEDRONATE IV KIT
19.3000 | PACK | Freq: Once | INTRAVENOUS | Status: AC
  Administered 2021-03-30: 19.3 via INTRAVENOUS

## 2021-04-02 ENCOUNTER — Ambulatory Visit
Admission: RE | Admit: 2021-04-02 | Discharge: 2021-04-02 | Disposition: A | Discharge status: Home / Self Care | Payer: BC Managed Care – PPO | Source: Ambulatory Visit | Attending: Radiation Oncology | Admitting: Radiation Oncology

## 2021-04-02 ENCOUNTER — Other Ambulatory Visit: Payer: Self-pay

## 2021-04-02 VITALS — BP 151/76 | HR 81 | Temp 97.6°F | Resp 20 | Ht 70.5 in | Wt 209.4 lb

## 2021-04-02 DIAGNOSIS — Z79899 Other long term (current) drug therapy: Secondary | ICD-10-CM | POA: Insufficient documentation

## 2021-04-02 DIAGNOSIS — K219 Gastro-esophageal reflux disease without esophagitis: Secondary | ICD-10-CM | POA: Diagnosis not present

## 2021-04-02 DIAGNOSIS — I10 Essential (primary) hypertension: Secondary | ICD-10-CM | POA: Diagnosis not present

## 2021-04-02 DIAGNOSIS — I700 Atherosclerosis of aorta: Secondary | ICD-10-CM | POA: Insufficient documentation

## 2021-04-02 DIAGNOSIS — E785 Hyperlipidemia, unspecified: Secondary | ICD-10-CM | POA: Insufficient documentation

## 2021-04-02 DIAGNOSIS — C61 Malignant neoplasm of prostate: Secondary | ICD-10-CM | POA: Insufficient documentation

## 2021-04-02 NOTE — Progress Notes (Signed)
Radiation Oncology         [phone] ________________________________  Initial Outpatient Consultation  Name: Edward Taylor MRN: [id number]  Date: 04/02/2021  DOB: [birth year]  UU:VOZDGU, Cletus Gash, MD  McKenzie, Candee Furbish, MD   REFERRING PHYSICIAN: Cleon Gustin, MD  DIAGNOSIS: 66 y.o. 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    ICD-10-CM   1. Malignant neoplasm of prostate ([place])  C61       HISTORY OF PRESENT ILLNESS: Edward Taylor is a 66 y.o. male with a diagnosis of prostate cancer. He was noted to have a rising elevated PSA of 7.5 by his primary care physician, Dr. Livia Snellen in the Fall of 2021.  Accordingly, he was referred for evaluation in urology by Dr. Alyson Ingles on 12/23/19,  digital rectal examination was performed at that time revealing a 20 gm gland with no nodules. After discussing options, the patient had repeat PSA which was lower at 7.4.  He elected continued PSA monitoring.    PSA increased to 9.8 on 02/14/21. The patient proceeded to transrectal ultrasound with 12 biopsies of the prostate on 02/28/21.  The prostate volume measured 27.8 cc.  Out of 12 core biopsies, 10 were positive.  The maximum Gleason score was 4+3, and this was seen in 8 areas, shown below.    The patient reviewed the biopsy results with his urologist and he has kindly been referred today for discussion of potential radiation treatment options.   PREVIOUS RADIATION THERAPY: No  PAST MEDICAL HISTORY:  Past Medical History:  Diagnosis Date   Allergy    Basal cell carcinoma    GERD (gastroesophageal reflux disease)    Hyperlipidemia    Hypertension       PAST SURGICAL HISTORY:No past surgical history on file.  FAMILY HISTORY:  Family History  Problem Relation Age of Onset   Cancer Mother    Hypertension Mother    Arthritis Father    Diabetes Father    Hyperlipidemia Father    Hypertension Father    Stroke Father     Heart disease Maternal Grandfather    Heart disease Paternal Grandfather     SOCIAL HISTORY:  Social History   Socioeconomic History   Marital status: Married    Spouse name: Not on file   Number of children: Not on file   Years of education: Not on file   Highest education level: Not on file  Occupational History   Occupation: Gen manager/COO  Tobacco Use   Smoking status: Never   Smokeless tobacco: Never  Vaping Use   Vaping Use: Never used  Substance and Sexual Activity   Alcohol use: Yes    Alcohol/week: 4.0 standard drinks    Types: 4 Glasses of wine per week   Drug use: No   Sexual activity: Yes    Birth control/protection: None  Other Topics Concern   Not on file  Social History Narrative   Not on file   Social Determinants of Health   Financial Resource Strain: Not on file  Food Insecurity: Not on file  Transportation Needs: Not on file  Physical Activity: Not on file  Stress: Not on file  Social Connections: Not on file  Intimate Partner Violence: Not on file    ALLERGIES: Patient has no known allergies.  MEDICATIONS:  Current Outpatient Medications  Medication Sig Dispense Refill   cetirizine (ZYRTEC) 10 MG tablet Take 10 mg by mouth daily.  famotidine (PEPCID) 40 MG tablet TAKE 1 TABLET DAILY 90 tablet 3   Fexofenadine-Pseudoephedrine (ALLEGRA-D 24 HOUR PO)      fluticasone (FLONASE) 50 MCG/ACT nasal spray Place 1 spray into both nostrils daily. 150 g 1   lisinopril (ZESTRIL) 20 MG tablet TAKE 1 TABLET DAILY 90 tablet 3   meloxicam (MOBIC) 15 MG tablet Take 15 mg by mouth daily.     metoprolol succinate (TOPROL-XL) 100 MG 24 hr tablet Take 1 tablet (100 mg total) by mouth daily. Take with or immediately following a meal. 90 tablet 3   Multiple Vitamins-Minerals (VITAMIN D3 COMPLETE PO) Take by mouth.     sildenafil (REVATIO) 20 MG tablet TAKE 2-5 PILLS AT ONCE BY MOUTH WITH EACH SEXUAL ENCOUNTER 100 tablet 3   simvastatin (ZOCOR) 40 MG tablet  TAKE 1 TABLET DAILY AT     6:00PM 90 tablet 3   zinc gluconate 50 MG tablet Take 50 mg by mouth daily.     No current facility-administered medications for this encounter.    REVIEW OF SYSTEMS:  On review of systems, the patient reports that he is doing well overall. He denies any chest pain, shortness of breath, cough, fevers, chills, night sweats, unintended weight changes. He denies any bowel disturbances, and denies abdominal pain, nausea or vomiting. He denies any new musculoskeletal or joint aches or pains. His IPSS was Total Score: 1, indicating mild urinary symptoms.  His SHIM: 24, indicating he has no erectile dysfunction. A complete review of systems is obtained and is otherwise negative.   PHYSICAL EXAM:  Wt Readings from Last 3 Encounters:  04/02/21 209 lb 6.4 oz (95 kg)  02/21/21 200 lb (90.7 kg)  11/20/20 202 lb 9.6 oz (91.9 kg)   Temp Readings from Last 3 Encounters:  04/02/21 97.6 F (36.4 C)  02/28/21 98 F (36.7 C) (Oral)  11/20/20 97.6 F (36.4 C) (Temporal)   BP Readings from Last 3 Encounters:  04/02/21 (!) 151/76  03/07/21 (!) 164/77  02/28/21 (!) 146/76   Pulse Readings from Last 3 Encounters:  04/02/21 81  03/07/21 71  02/28/21 80   Pain Assessment Pain Score: 0-No pain/10  In general this is a well appearing gentleman in no acute distress. He's alert and oriented x4 and appropriate throughout the examination. Cardiopulmonary assessment is negative for acute distress, and he exhibits normal effort.    KPS = 100  100 - Normal; no complaints; no evidence of disease. 90   - Able to carry on normal activity; minor signs or symptoms of disease. 80   - Normal activity with effort; some signs or symptoms of disease. 69   - Cares for self; unable to carry on normal activity or to do active work. 60   - Requires occasional assistance, but is able to care for most of his personal needs. 50   - Requires considerable assistance and frequent medical care. 31    - Disabled; requires special care and assistance. 73   - Severely disabled; hospital admission is indicated although death not imminent. 44   - Very sick; hospital admission necessary; active supportive treatment necessary. 10   - Moribund; fatal processes progressing rapidly. 0     - Dead  Karnofsky DA, Abelmann WH, Craver LS and Burchenal [facility] [phone]) The use of the nitrogen mustards in the palliative treatment of carcinoma: with particular reference to bronchogenic carcinoma Cancer 1 634-56  LABORATORY DATA:  Lab Results  Component Value Date   WBC 8.3  11/20/2020   HGB 15.9 11/20/2020   HCT 46.8 11/20/2020   MCV 91 11/20/2020   PLT 213 11/20/2020   Lab Results  Component Value Date   NA 142 11/20/2020   K 4.5 11/20/2020   CL 102 11/20/2020   CO2 23 11/20/2020   Lab Results  Component Value Date   ALT 34 11/20/2020   AST 21 11/20/2020   ALKPHOS 58 11/20/2020   BILITOT 0.5 11/20/2020     RADIOGRAPHY: CT Abdomen Pelvis W Wo Contrast  Result Date: 03/31/2021 CLINICAL DATA:  Prostate cancer, high risk, staging. EXAM: CT ABDOMEN AND PELVIS WITHOUT AND WITH CONTRAST TECHNIQUE: Multidetector CT imaging of the abdomen and pelvis was performed following the standard protocol before and following the bolus administration of intravenous contrast. RADIATION DOSE REDUCTION: This exam was performed according to the departmental dose-optimization program which includes automated exposure control, adjustment of the mA and/or kV according to patient size and/or use of iterative reconstruction technique. CONTRAST:  153mL OMNIPAQUE IOHEXOL 300 MG/ML  SOLN COMPARISON:  None. FINDINGS: Lower chest: No acute abnormality. Hepatobiliary: No focal liver abnormality is seen. No gallstones, gallbladder wall thickening, or biliary dilatation. Pancreas: Unremarkable Spleen: Unremarkable Adrenals/Urinary Tract: Adrenal glands are unremarkable. Kidneys are normal, without renal calculi, focal lesion, or  hydronephrosis. Bladder is unremarkable. Stomach/Bowel: Moderate sigmoid diverticulosis. The stomach, small bowel, and large bowel are otherwise unremarkable. Appendix normal. No free intraperitoneal gas or fluid. Vascular/Lymphatic: Mild atherosclerotic calcification within the abdominal aorta. No aortic aneurysm. The abdominal vasculature is otherwise unremarkable. No pathologic adenopathy within the abdomen and pelvis. Reproductive: Prostate is unremarkable. Other: Tiny fat containing umbilical hernia. Small left inguinal cord lipoma. Rectum unremarkable. Musculoskeletal: No lytic or blastic bone lesions. No acute bone abnormality. IMPRESSION: No evidence of regional nodal or distant metastatic disease within the abdomen and pelvis. Moderate sigmoid diverticulosis. Aortic Atherosclerosis (ICD10-I70.0). Electronically Signed   By: Fidela Salisbury M.D.   On: 03/31/2021 22:07   NM Bone Scan Whole Body  Result Date: 03/31/2021 CLINICAL DATA:  Prostate cancer, initial staging examination EXAM: NUCLEAR MEDICINE WHOLE BODY BONE SCAN TECHNIQUE: Whole body anterior and posterior images were obtained approximately 3 hours after intravenous injection of radiopharmaceutical. RADIOPHARMACEUTICALS:  19.3 mCi Technetium-49m MDP IV COMPARISON:  None. FINDINGS: Focal uptake within the shoulders, sternoclavicular joints, sternomanubrial joint, knees, and feet bilaterally are likely degenerative in nature. Focal uptake within the left lumbosacral junction corresponds to a asymmetric facet arthrosis at L5-S1. No evidence of osseous metastatic disease. Normal soft tissue distribution. Normal uptake and excretion within the kidneys and bladder. IMPRESSION: No osseous metastatic disease. Electronically Signed   By: Fidela Salisbury M.D.   On: 03/31/2021 22:01      IMPRESSION/PLAN: 1. 66 y.o. 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 We discussed the patient's workup and  outlined the nature of prostate cancer in this setting. The patient's T stage, Gleason's score, and PSA put him into the Unfavorable Intermediate Risk group. Accordingly, he is eligible for a variety of potential treatment options including brachytherapy, ADT in combination with 5.5 weeks of external radiation, or prostatectomy. We discussed the available radiation techniques, and focused on the details and logistics of delivery.  We discussed and outlined the risks, benefits, short and long-term effects associated with radiotherapy and compared and contrasted these with prostatectomy. We discussed the role of SpaceOAR gel in reducing the rectal toxicity associated with radiotherapy. We also detailed the role of ADT  in the treatment of UIR prostate cancer and outlined the associated side effects that could be expected with this therapy.  He appears to have a good understanding of his disease and our treatment recommendations which are of curative intent.  He was encouraged to ask questions that were answered to his stated satisfaction.  At the conclusion of our conversation, the patient is interested in moving forward with 6 months short term ADT with IMRT for 5 1/2 weeks.  We personally spent 60 minutes in this encounter including chart review, reviewing radiological studies, meeting face-to-face with the patient, entering orders and completing documentation.      Tyler Pita, MD  [facility]   Radiation Oncology Direct Dial: [phone]   Fax: [phone] [email or web address]   Skype   LinkedIn

## 2021-04-02 NOTE — Progress Notes (Signed)
Introduced myself to the patient as the prostate nurse navigator.  No barriers to care identified at this time.  He is here to discuss his radiation treatment options.  I gave him my business card and asked him to call me with questions or concerns.  Verbalized understanding.  ?

## 2021-04-04 ENCOUNTER — Ambulatory Visit (INDEPENDENT_AMBULATORY_CARE_PROVIDER_SITE_OTHER): Payer: BC Managed Care – PPO | Admitting: Urology

## 2021-04-04 ENCOUNTER — Other Ambulatory Visit: Payer: Self-pay | Admitting: Urology

## 2021-04-04 ENCOUNTER — Encounter: Payer: Self-pay | Admitting: Urology

## 2021-04-04 ENCOUNTER — Other Ambulatory Visit: Payer: Self-pay

## 2021-04-04 VITALS — BP 130/63 | HR 85 | Wt 207.2 lb

## 2021-04-04 DIAGNOSIS — R972 Elevated prostate specific antigen [PSA]: Secondary | ICD-10-CM

## 2021-04-04 DIAGNOSIS — C61 Malignant neoplasm of prostate: Secondary | ICD-10-CM

## 2021-04-04 LAB — URINALYSIS, ROUTINE W REFLEX MICROSCOPIC
Bilirubin, UA: NEGATIVE
Glucose, UA: NEGATIVE
Leukocytes,UA: NEGATIVE
Nitrite, UA: NEGATIVE
RBC, UA: NEGATIVE
Specific Gravity, UA: 1.025 (ref 1.005–1.030)
Urobilinogen, Ur: 2 mg/dL — ABNORMAL HIGH (ref 0.2–1.0)
pH, UA: 5.5 (ref 5.0–7.5)

## 2021-04-04 LAB — MICROSCOPIC EXAMINATION
Bacteria, UA: NONE SEEN
Epithelial Cells (non renal): NONE SEEN /hpf (ref 0–10)
RBC, Urine: NONE SEEN /hpf (ref 0–2)
Renal Epithel, UA: NONE SEEN /hpf
WBC, UA: NONE SEEN /hpf (ref 0–5)

## 2021-04-04 MED ORDER — ORGOVYX 120 MG PO TABS: 1.0000 | ORAL_TABLET | Freq: Every day | ORAL | 4 refills | Status: DC

## 2021-04-04 NOTE — Progress Notes (Signed)
? ?04/04/2021 ?9:04 AM  ? ?[name] ?10-05-1955 ?[id number] ? ?Referring provider: Claretta Fraise, MD ?[address] [place],  [place] [address] ? ?Followup Prostate cancer ? ? ?HPI: ?Mr Nickson is 66yo here for followup for prostate cancer. He met with Dr. Tammi Klippel and elected for IMRT with 6 months ADT. Mild LUTS.  ? ? ?PMH: ?Past Medical History:  ?Diagnosis Date  ? Allergy   ? Basal cell carcinoma   ? GERD (gastroesophageal reflux disease)   ? Hyperlipidemia   ? Hypertension   ? ? ?Surgical History: ?No past surgical history on file. ? ?Home Medications:  ?Allergies as of 04/04/2021   ?No Known Allergies ?  ? ?  ?Medication List  ?  ? ?  ? Accurate as of April 04, 2021  9:04 AM. If you have any questions, ask your nurse or doctor.  ?  ?  ? ?  ? ?ALLEGRA-D 24 HOUR PO ?  ?cetirizine 10 MG tablet ?Commonly known as: ZYRTEC ?Take 10 mg by mouth daily. ?  ?famotidine 40 MG tablet ?Commonly known as: PEPCID ?TAKE 1 TABLET DAILY ?  ?fluticasone 50 MCG/ACT nasal spray ?Commonly known as: FLONASE ?Place 1 spray into both nostrils daily. ?  ?lisinopril 20 MG tablet ?Commonly known as: ZESTRIL ?TAKE 1 TABLET DAILY ?  ?meloxicam 15 MG tablet ?Commonly known as: MOBIC ?Take 15 mg by mouth daily. ?  ?metoprolol succinate 100 MG 24 hr tablet ?Commonly known as: TOPROL-XL ?Take 1 tablet (100 mg total) by mouth daily. Take with or immediately following a meal. ?  ?sildenafil 20 MG tablet ?Commonly known as: REVATIO ?TAKE 2-5 PILLS AT ONCE BY MOUTH WITH EACH SEXUAL ENCOUNTER ?  ?simvastatin 40 MG tablet ?Commonly known as: ZOCOR ?TAKE 1 TABLET DAILY AT     6:00PM ?  ?VITAMIN D3 COMPLETE PO ?Take by mouth. ?  ?zinc gluconate 50 MG tablet ?Take 50 mg by mouth daily. ?  ? ?  ? ? ?Allergies: No Known Allergies ? ?Family History: ?Family History  ?Problem Relation Age of Onset  ? Cancer Mother   ? Hypertension Mother   ? Arthritis Father   ? Diabetes Father   ? Hyperlipidemia Father   ? Hypertension Father   ? Stroke Father   ? Heart  disease Maternal Grandfather   ? Heart disease Paternal Grandfather   ? ? ?Social History:  reports that he has never smoked. He has never used smokeless tobacco. He reports current alcohol use of about 4.0 standard drinks per week. He reports that he does not use drugs. ? ?ROS: ?All other review of systems were reviewed and are negative except what is noted above in HPI ? ?Physical Exam: ?BP 130/63   Pulse 85   Wt 207 lb 3.2 oz (94 kg)   BMI 29.31 kg/m?   ?Constitutional:  Alert and oriented, No acute distress. ?HEENT: [place] AT, moist mucus membranes.  Trachea midline, no masses. ?Cardiovascular: No clubbing, cyanosis, or edema. ?Respiratory: Normal respiratory effort, no increased work of breathing. ?GI: Abdomen is soft, nontender, nondistended, no abdominal masses ?GU: No CVA tenderness.  ?Lymph: No cervical or inguinal lymphadenopathy. ?Skin: No rashes, bruises or suspicious lesions. ?Neurologic: Grossly intact, no focal deficits, moving all 4 extremities. ?Psychiatric: Normal mood and affect. ? ?Laboratory Data: ?Lab Results  ?Component Value Date  ? WBC 8.3 11/20/2020  ? HGB 15.9 11/20/2020  ? HCT 46.8 11/20/2020  ? MCV 91 11/20/2020  ? PLT 213 11/20/2020  ? ? ?Lab Results  ?  Component Value Date  ? CREATININE 0.80 03/30/2021  ? ? ?No results found for: PSA ? ?No results found for: TESTOSTERONE ? ?No results found for: HGBA1C ? ?Urinalysis ?   ?Component Value Date/Time  ? APPEARANCEUR Clear 02/21/2021 0905  ? GLUCOSEU Negative 02/21/2021 0905  ? BILIRUBINUR Negative 02/21/2021 0905  ? PROTEINUR Negative 02/21/2021 0905  ? NITRITE Negative 02/21/2021 0905  ? LEUKOCYTESUR Negative 02/21/2021 0905  ? ? ?Lab Results  ?Component Value Date  ? LABMICR Comment 02/21/2021  ? ? ?Pertinent Imaging: ? ?No results found for this or any previous visit. ? ?No results found for this or any previous visit. ? ?No results found for this or any previous visit. ? ?No results found for this or any previous visit. ? ?No results  found for this or any previous visit. ? ?No results found for this or any previous visit. ? ?No results found for this or any previous visit. ? ?No results found for this or any previous visit. ? ? ?Assessment & Plan:   ? ?1. Prostate cancer ([place]) ?-We will proceed with fiducial markers with SpaceOAR. We will start orgovyx $RemoveBefor'360mg'[id number]$  today and then $Remove'120mg'EcZONPD$  daily for 6 months ?- Urinalysis, Routine w reflex microscopic ? ? ?No follow-ups on file. ? ?[name], MD ? ?[facility] ?  ?

## 2021-04-04 NOTE — Patient Instructions (Signed)
Prostate Cancer °The prostate is a small gland that helps make semen. It is located below a man's bladder, in front of the rectum. Prostate cancer is when abnormal cells grow in this gland. °What are the causes? °The cause of this condition is not known. °What increases the risk? °Being age 65 or older. °Having a family history of prostate cancer. °Having a family history of cancer of the breasts or ovaries. °Having genes that are passed from parent to child (inherited). °Having Lynch syndrome. °African American men and men of African descent are diagnosed with prostate cancer at higher rates than other men. °What are the signs or symptoms? °Problems peeing (urinating). This may include: °A stream that is weak, or pee that stops and starts. °Trouble starting or stopping your pee. °Trouble emptying all of your pee. °Needing to pee more often, especially at night. °Blood in your pee or semen. °Pain in the: °Lower back. °Lower belly (abdomen). °Hips. °Trouble getting an erection. °Weakness or numbness in the legs or feet. °How is this treated? °Treatment for this condition depends on: °How much the cancer has spread. °Your age. °The kind of treatment you want. °Your health. °Treatments include: °Being watched. This is called observation. You will be tested from time to time, but you will not get treated. Tests are to make sure that the cancer is not growing. °Surgery. This may be done to: °Take out (remove) the prostate. °Freeze and kill cancer cells. °Radiation. This uses a strong beam of energy to kill cancer cells. °Chemotherapy. This uses medicines that stop cancer cells from increasing. This kills cancer cells and healthy cells. °Targeted therapy. This kills cancer cells only. Healthy cells are not affected. °Hormone treatment. This stops the body from making hormones that help the cancer cells grow. °Follow these instructions at home: °Lifestyle °Do not smoke or use any products that contain nicotine or tobacco.  If you need help quitting, ask your doctor. °Eat a healthy diet. °Treatment may affect your ability to have sex. If you have a partner, touch, hold, hug, and caress your partner to have intimate moments. °Get plenty of sleep. °Ask your doctor for help to find a support group for men with prostate cancer. °General instructions °Take over-the-counter and prescription medicines only as told by your doctor. °If you have to go to the hospital, let your cancer doctor (oncologist) know. °Keep all follow-up visits. °Where to find more information °American Cancer Society: www.cancer.org °American Society of Clinical Oncology: www.cancer.net °National Cancer Institute: www.cancer.gov °Contact a doctor if: °You have new or more trouble peeing. °You have new or more blood in your pee. °You have new or more pain in your hips, back, or chest. °Get help right away if: °You have weakness in your legs. °You lose feeling in your legs. °You cannot control your pee or your poop (stool). °You have chills or a fever. °Summary °The prostate is a male gland that helps make semen. °Prostate cancer is when abnormal cells grow in this gland. °Treatment includes doing surgery, using medicines, using strong beams of energy, or watching without treatment. °Ask your doctor for help to find a support group for men with prostate cancer. °Contact a doctor if you have problems peeing or have any new pain that you did not have before. °This information is not intended to replace advice given to you by your health care provider. Make sure you discuss any questions you have with your health care provider. °Document Revised: 04/19/2020 Document Reviewed: 04/19/2020 °Elsevier   Patient Education © 2022 Elsevier Inc. ° °

## 2021-04-04 NOTE — Progress Notes (Signed)
Surgical Physician Order [facility] [place] ? ?* Scheduling expectation :  05/24/2021 ? ?*Length of Case: 30 minutes ? ?*MD Preforming Case: [name], MD ? ?*Assistant Needed: no ? ?*Facility Preference: [facility] ? ?*Clearance needed: no ? ?*Anticoagulation Instructions: Hold all anticoagulants ? ?*Aspirin Instructions: Ok to continue Aspirin ? ?-Admit type: OUTpatient ? ?-Anesthesia: General ? ?-Use Standing Orders:  NA ? ?*Diagnosis: Prostate Cancer ? ?*Procedure:  Fiducial markers with SpaceOAR     ? ?Additional orders: N/A ? ?-Equipment:    ?-VTE Prophylaxis Standing Order SCD?s    ?   ?Other:  ? ?-Standing Lab Orders Per Anesthesia   ? ?Lab other: None ? ?-Standing Test orders EKG/Chest x-ray per Anesthesia      ? ?Test other:  ? ?- Medications:  Ancef 2gm IV ? ?-Other orders:  Fleets enema AM ? ?*Post-op visit Date/Instructions:   2 weeks   ?

## 2021-04-06 ENCOUNTER — Telehealth: Payer: Self-pay

## 2021-04-06 NOTE — Telephone Encounter (Signed)
I spoke with Edward Taylor. We have discussed possible surgery dates and Thursday April 20th, 2023 was agreed upon by all parties. Patient given information about surgery date, what to expect pre-operatively and post operatively. We discussed that a pre-op nurse will be calling to set up the pre-op visit that will take place prior to surgery. Informed patient that our office will communicate any additional care to be provided after surgery. Patients questions or concerns were discussed during our call. Advised to call our office should there be any additional information, questions or concerns that arise. Patient verbalized understanding.  ? ?

## 2021-04-06 NOTE — Addendum Note (Signed)
Addended by: Gerald Leitz A on: 04/06/2021 02:03 PM ? ? Modules accepted: Orders ? ?

## 2021-04-06 NOTE — Progress Notes (Signed)
[facility] Surgery Posting Form  ? ?Surgery Date/Time: Date: 05/24/2021 ? ?Surgeon: Dr. [name], MD ? ?Surgery Location: Day Surgery ? ?Inpt ( No  )   Outpt (Yes)   Obs ( No  )  ? ?Diagnosis: C61 Prostate Cancer ? ?-CPT: [id number], [id number], [id number], [phone] ? ?Surgery: Fiducial Markers with SpaceOar instillation ? ?Stop Anticoagulations: Yes, ok to continue ASA ? ?Cardiac/Medical/Pulmonary Clearance needed: no ? ?*Orders entered into EPIC  Date: 04/06/21  ? ?*Case booked in [place]  Date: 04/04/2021 ? ?*Notified pt of Surgery: Date: 04/04/2021 ? ?PRE-OP UA & CX: no ? ?*Placed into Prior Authorization Work Que Date: 04/06/21 ? ? ?Assistant/laser/rep:No ? ? ? ? ? ? ? ? ? ? ? ? ? ? ? ?

## 2021-04-18 ENCOUNTER — Encounter: Payer: Self-pay | Admitting: Urology

## 2021-05-01 ENCOUNTER — Other Ambulatory Visit: Payer: Self-pay

## 2021-05-01 ENCOUNTER — Other Ambulatory Visit: Payer: BC Managed Care – PPO

## 2021-05-01 DIAGNOSIS — C61 Malignant neoplasm of prostate: Secondary | ICD-10-CM | POA: Diagnosis not present

## 2021-05-02 LAB — TESTOSTERONE: Testosterone: <3 ng/dL — ABNORMAL LOW (ref 264–916)

## 2021-05-02 LAB — PSA: Prostate Specific Ag, Serum: 1 ng/mL (ref 0.0–4.0)

## 2021-05-08 ENCOUNTER — Ambulatory Visit: Payer: BC Managed Care – PPO | Admitting: Urology

## 2021-05-11 ENCOUNTER — Encounter: Payer: Self-pay | Admitting: Urology

## 2021-05-11 ENCOUNTER — Ambulatory Visit (INDEPENDENT_AMBULATORY_CARE_PROVIDER_SITE_OTHER): Payer: BC Managed Care – PPO | Admitting: Urology

## 2021-05-11 VITALS — BP 127/73 | HR 80

## 2021-05-11 DIAGNOSIS — C61 Malignant neoplasm of prostate: Secondary | ICD-10-CM | POA: Diagnosis not present

## 2021-05-11 DIAGNOSIS — R972 Elevated prostate specific antigen [PSA]: Secondary | ICD-10-CM | POA: Diagnosis not present

## 2021-05-11 LAB — URINALYSIS, ROUTINE W REFLEX MICROSCOPIC
Bilirubin, UA: NEGATIVE
Glucose, UA: NEGATIVE
Ketones, UA: NEGATIVE
Leukocytes,UA: NEGATIVE
Nitrite, UA: NEGATIVE
Protein,UA: NEGATIVE
RBC, UA: NEGATIVE
Specific Gravity, UA: 1.025 (ref 1.005–1.030)
Urobilinogen, Ur: 0.2 mg/dL (ref 0.2–1.0)
pH, UA: 6.5 (ref 5.0–7.5)

## 2021-05-11 MED ORDER — MEGESTROL ACETATE 20 MG PO TABS: 20.0000 mg | ORAL_TABLET | Freq: Two times a day (BID) | ORAL | 3 refills | Status: DC | PRN

## 2021-05-11 NOTE — Progress Notes (Signed)
? ?05/11/2021 ?9:18 AM  ? ?Edward Taylor ?[birth year] ?[id number] ? ?Referring provider: Claretta Fraise, MD ?[address] [place],  [place] [address] ? ?Followup prostate cancer ? ? ?HPI: ?Mr Edward Taylor is a 66yo here for followup for prostate cancer. He is on orgovyx. PSA 1.0 down from 9.8. Testosterone <3. He has mild hot flashes. NO significant LUTS. No other complaints today. He is scheduled for markers with [facility] in 2 weeks.  ? ? ?PMH: ?Past Medical History:  ?Diagnosis Date  ? Allergy   ? Basal cell carcinoma   ? GERD (gastroesophageal reflux disease)   ? Hyperlipidemia   ? Hypertension   ? ? ?Surgical History: ?No past surgical history on file. ? ?Home Medications:  ?Allergies as of 05/11/2021   ?No Known Allergies ?  ? ?  ?Medication List  ?  ? ?  ? Accurate as of May 11, 2021  9:18 AM. If you have any questions, ask your nurse or doctor.  ?  ?  ? ?  ? ?ALLEGRA-D 24 HOUR PO ?  ?cetirizine 10 MG tablet ?Commonly known as: ZYRTEC ?Take 10 mg by mouth daily. ?  ?famotidine 40 MG tablet ?Commonly known as: PEPCID ?TAKE 1 TABLET DAILY ?  ?fluticasone 50 MCG/ACT nasal spray ?Commonly known as: FLONASE ?Place 1 spray into both nostrils daily. ?  ?lisinopril 20 MG tablet ?Commonly known as: ZESTRIL ?TAKE 1 TABLET DAILY ?  ?meloxicam 15 MG tablet ?Commonly known as: MOBIC ?Take 15 mg by mouth daily. ?  ?metoprolol succinate 100 MG 24 hr tablet ?Commonly known as: TOPROL-XL ?Take 1 tablet (100 mg total) by mouth daily. Take with or immediately following a meal. ?  ?Orgovyx 120 MG Tabs ?Generic drug: Relugolix ?Take 1 capsule by mouth daily. ?  ?sildenafil 20 MG tablet ?Commonly known as: REVATIO ?TAKE 2-5 PILLS AT ONCE BY MOUTH WITH EACH SEXUAL ENCOUNTER ?  ?simvastatin 40 MG tablet ?Commonly known as: ZOCOR ?TAKE 1 TABLET DAILY AT     6:00PM ?  ?VITAMIN D3 COMPLETE PO ?Take by mouth. ?  ?zinc gluconate 50 MG tablet ?Take 50 mg by mouth daily. ?  ? ?  ? ? ?Allergies: No Known Allergies ? ?Family History: ?Family History   ?Problem Relation Age of Onset  ? Cancer Mother   ? Hypertension Mother   ? Arthritis Father   ? Diabetes Father   ? Hyperlipidemia Father   ? Hypertension Father   ? Stroke Father   ? Heart disease Maternal Grandfather   ? Heart disease Paternal Grandfather   ? ? ?Social History:  reports that he has never smoked. He has never used smokeless tobacco. He reports current alcohol use of about 4.0 standard drinks per week. He reports that he does not use drugs. ? ?ROS: ?All other review of systems were reviewed and are negative except what is noted above in HPI ? ?Physical Exam: ?BP 127/73   Pulse 80   ?Constitutional:  Alert and oriented, No acute distress. ?HEENT: [place] AT, moist mucus membranes.  Trachea midline, no masses. ?Cardiovascular: No clubbing, cyanosis, or edema. ?Respiratory: Normal respiratory effort, no increased work of breathing. ?GI: Abdomen is soft, nontender, nondistended, no abdominal masses ?GU: No CVA tenderness.  ?Lymph: No cervical or inguinal lymphadenopathy. ?Skin: No rashes, bruises or suspicious lesions. ?Neurologic: Grossly intact, no focal deficits, moving all 4 extremities. ?Psychiatric: Normal mood and affect. ? ?Laboratory Data: ?Lab Results  ?Component Value Date  ? WBC 8.3 11/20/2020  ? HGB 15.9 11/20/2020  ?  HCT 46.8 11/20/2020  ? MCV 91 11/20/2020  ? PLT 213 11/20/2020  ? ? ?Lab Results  ?Component Value Date  ? CREATININE 0.80 03/30/2021  ? ? ?No results found for: PSA ? ?Lab Results  ?Component Value Date  ? TESTOSTERONE <3 (L) 05/01/2021  ? ? ?No results found for: HGBA1C ? ?Urinalysis ?   ?Component Value Date/Time  ? APPEARANCEUR Clear 04/04/2021 0941  ? GLUCOSEU Negative 04/04/2021 0941  ? BILIRUBINUR Negative 04/04/2021 0941  ? PROTEINUR 1+ (A) 04/04/2021 0941  ? NITRITE Negative 04/04/2021 0941  ? LEUKOCYTESUR Negative 04/04/2021 0941  ? ? ?Lab Results  ?Component Value Date  ? LABMICR See below: 04/04/2021  ? [place] None seen 04/04/2021  ? LABEPIT None seen 04/04/2021  ?  MUCUS Present 04/04/2021  ? BACTERIA None seen 04/04/2021  ? ? ?Pertinent Imaging: ? ?No results found for this or any previous visit. ? ?No results found for this or any previous visit. ? ?No results found for this or any previous visit. ? ?No results found for this or any previous visit. ? ?No results found for this or any previous visit. ? ?No results found for this or any previous visit. ? ?No results found for this or any previous visit. ? ?No results found for this or any previous visit. ? ? ?Assessment & Plan:   ? ?1. Prostate cancer ([place]) ?-Continue orgoxyx ?-Patient scheduled for fiducial markers with [facility]. ?- Urinalysis, Routine w reflex microscopic ? ? ? ? ?No follow-ups on file. ? ?Edward Bang, MD ? ?[facility] ?  ?

## 2021-05-11 NOTE — H&P (View-Only) (Signed)
? ?05/11/2021 ?9:18 AM  ? ?Edward Taylor ?Dec 29, 1955 ?[id number] ? ?Referring provider: Claretta Fraise, MD ?[address] [place],  [place] [address] ? ?Followup prostate cancer ? ? ?HPI: ?Edward Taylor is a 66yo here for followup for prostate cancer. He is on orgovyx. PSA 1.0 down from 9.8. Testosterone <3. He has mild hot flashes. NO significant LUTS. No other complaints today. He is scheduled for markers with [facility] in 2 weeks.  ? ? ?PMH: ?Past Medical History:  ?Diagnosis Date  ? Allergy   ? Basal cell carcinoma   ? GERD (gastroesophageal reflux disease)   ? Hyperlipidemia   ? Hypertension   ? ? ?Surgical History: ?No past surgical history on file. ? ?Home Medications:  ?Allergies as of 05/11/2021   ?No Known Allergies ?  ? ?  ?Medication List  ?  ? ?  ? Accurate as of May 11, 2021  9:18 AM. If you have any questions, ask your nurse or doctor.  ?  ?  ? ?  ? ?ALLEGRA-D 24 HOUR PO ?  ?cetirizine 10 MG tablet ?Commonly known as: ZYRTEC ?Take 10 mg by mouth daily. ?  ?famotidine 40 MG tablet ?Commonly known as: PEPCID ?TAKE 1 TABLET DAILY ?  ?fluticasone 50 MCG/ACT nasal spray ?Commonly known as: FLONASE ?Place 1 spray into both nostrils daily. ?  ?lisinopril 20 MG tablet ?Commonly known as: ZESTRIL ?TAKE 1 TABLET DAILY ?  ?meloxicam 15 MG tablet ?Commonly known as: MOBIC ?Take 15 mg by mouth daily. ?  ?metoprolol succinate 100 MG 24 hr tablet ?Commonly known as: TOPROL-XL ?Take 1 tablet (100 mg total) by mouth daily. Take with or immediately following a meal. ?  ?Orgovyx 120 MG Tabs ?Generic drug: Relugolix ?Take 1 capsule by mouth daily. ?  ?sildenafil 20 MG tablet ?Commonly known as: REVATIO ?TAKE 2-5 PILLS AT ONCE BY MOUTH WITH EACH SEXUAL ENCOUNTER ?  ?simvastatin 40 MG tablet ?Commonly known as: ZOCOR ?TAKE 1 TABLET DAILY AT     6:00PM ?  ?VITAMIN D3 COMPLETE PO ?Take by mouth. ?  ?zinc gluconate 50 MG tablet ?Take 50 mg by mouth daily. ?  ? ?  ? ? ?Allergies: No Known Allergies ? ?Family History: ?Family History   ?Problem Relation Age of Onset  ? Cancer Mother   ? Hypertension Mother   ? Arthritis Father   ? Diabetes Father   ? Hyperlipidemia Father   ? Hypertension Father   ? Stroke Father   ? Heart disease Maternal Grandfather   ? Heart disease Paternal Grandfather   ? ? ?Social History:  reports that he has never smoked. He has never used smokeless tobacco. He reports current alcohol use of about 4.0 standard drinks per week. He reports that he does not use drugs. ? ?ROS: ?All other review of systems were reviewed and are negative except what is noted above in HPI ? ?Physical Exam: ?BP 127/73   Pulse 80   ?Constitutional:  Alert and oriented, No acute distress. ?HEENT: [place] AT, moist mucus membranes.  Trachea midline, no masses. ?Cardiovascular: No clubbing, cyanosis, or edema. ?Respiratory: Normal respiratory effort, no increased work of breathing. ?GI: Abdomen is soft, nontender, nondistended, no abdominal masses ?GU: No CVA tenderness.  ?Lymph: No cervical or inguinal lymphadenopathy. ?Skin: No rashes, bruises or suspicious lesions. ?Neurologic: Grossly intact, no focal deficits, moving all 4 extremities. ?Psychiatric: Normal mood and affect. ? ?Laboratory Data: ?Lab Results  ?Component Value Date  ? WBC 8.3 11/20/2020  ? HGB 15.9 11/20/2020  ?  HCT 46.8 11/20/2020  ? MCV 91 11/20/2020  ? PLT 213 11/20/2020  ? ? ?Lab Results  ?Component Value Date  ? CREATININE 0.80 03/30/2021  ? ? ?No results found for: PSA ? ?Lab Results  ?Component Value Date  ? TESTOSTERONE <3 (L) 05/01/2021  ? ? ?No results found for: HGBA1C ? ?Urinalysis ?   ?Component Value Date/Time  ? APPEARANCEUR Clear 04/04/2021 0941  ? GLUCOSEU Negative 04/04/2021 0941  ? BILIRUBINUR Negative 04/04/2021 0941  ? PROTEINUR 1+ (A) 04/04/2021 0941  ? NITRITE Negative 04/04/2021 0941  ? LEUKOCYTESUR Negative 04/04/2021 0941  ? ? ?Lab Results  ?Component Value Date  ? LABMICR See below: 04/04/2021  ? [place] None seen 04/04/2021  ? LABEPIT None seen 04/04/2021  ?  MUCUS Present 04/04/2021  ? BACTERIA None seen 04/04/2021  ? ? ?Pertinent Imaging: ? ?No results found for this or any previous visit. ? ?No results found for this or any previous visit. ? ?No results found for this or any previous visit. ? ?No results found for this or any previous visit. ? ?No results found for this or any previous visit. ? ?No results found for this or any previous visit. ? ?No results found for this or any previous visit. ? ?No results found for this or any previous visit. ? ? ?Assessment & Plan:   ? ?1. Prostate cancer ([place]) ?-Continue orgoxyx ?-Patient scheduled for fiducial markers with [facility]. ?- Urinalysis, Routine w reflex microscopic ? ? ? ? ?No follow-ups on file. ? ?[name], MD ? ?[facility] ?  ?

## 2021-05-11 NOTE — Patient Instructions (Signed)
Prostate Cancer °The prostate is a small gland that helps make semen. It is located below a man's bladder, in front of the rectum. Prostate cancer is when abnormal cells grow in this gland. °What are the causes? °The cause of this condition is not known. °What increases the risk? °Being age 64 or older. °Having a family history of prostate cancer. °Having a family history of cancer of the breasts or ovaries. °Having genes that are passed from parent to child (inherited). °Having Lynch syndrome. °African American men and men of African descent are diagnosed with prostate cancer at higher rates than other men. °What are the signs or symptoms? °Problems peeing (urinating). This may include: °A stream that is weak, or pee that stops and starts. °Trouble starting or stopping your pee. °Trouble emptying all of your pee. °Needing to pee more often, especially at night. °Blood in your pee or semen. °Pain in the: °Lower back. °Lower belly (abdomen). °Hips. °Trouble getting an erection. °Weakness or numbness in the legs or feet. °How is this treated? °Treatment for this condition depends on: °How much the cancer has spread. °Your age 66. °The kind of treatment you want. °Your health. °Treatments include: °Being watched. This is called observation. You will be tested from time to time, but you will not get treated. Tests are to make sure that the cancer is not growing. °Surgery. This may be done to: °Take out (remove) the prostate. °Freeze and kill cancer cells. °Radiation. This uses a strong beam of energy to kill cancer cells. °Chemotherapy. This uses medicines that stop cancer cells from increasing. This kills cancer cells and healthy cells. °Targeted therapy. This kills cancer cells only. Healthy cells are not affected. °Hormone treatment. This stops the body from making hormones that help the cancer cells grow. °Follow these instructions at home: °Lifestyle °Do not smoke or use any products that contain nicotine or tobacco.  If you need help quitting, ask your doctor. °Eat a healthy diet. °Treatment may affect your ability to have sex. If you have a partner, touch, hold, hug, and caress your partner to have intimate moments. °Get plenty of sleep. °Ask your doctor for help to find a support group for men with prostate cancer. °General instructions °Take over-the-counter and prescription medicines only as told by your doctor. °If you have to go to the hospital, let your cancer doctor (oncologist) know. °Keep all follow-up visits. °Where to find more information °American Cancer Society: www.cancer.org °American Society of Clinical Oncology: www.cancer.net °National Cancer Institute: www.cancer.gov °Contact a doctor if: °You have new or more trouble peeing. °You have new or more blood in your pee. °You have new or more pain in your hips, back, or chest. °Get help right away if: °You have weakness in your legs. °You lose feeling in your legs. °You cannot control your pee or your poop (stool). °You have chills or a fever. °Summary °The prostate is a male gland that helps make semen. °Prostate cancer is when abnormal cells grow in this gland. °Treatment includes doing surgery, using medicines, using strong beams of energy, or watching without treatment. °Ask your doctor for help to find a support group for men with prostate cancer. °Contact a doctor if you have problems peeing or have any new pain that you did not have before. °This information is not intended to replace advice given to you by your health care provider. Make sure you discuss any questions you have with your health care provider. °Document Revised: 04/19/2020 Document Reviewed: 04/19/2020 °Elsevier   Patient Education © 2022 Elsevier Inc. ° °

## 2021-05-18 NOTE — Patient Instructions (Signed)
? ? ? ? ? ? ? ? [name] ? 05/18/2021  ?  ? '@PREFPERIOPPHARMACY'$ @ ? ? Your procedure is scheduled on  05/24/2021. ? ? Report to [facility] at  [facility] ? ? Call this number if you have problems the morning of surgery: ? [phone] ? ? Remember: ? Do not eat or drink after midnight. ?  ?  ? Take these medicines the morning of surgery with A SIP OF WATER  ? ?          pepcid, allegra, mobic, metoprolol, orgovyx. ?  ? Do not wear jewelry, make-up or nail polish. ? Do not wear lotions, powders, or perfumes, or deodorant. ? Do not shave 48 hours prior to surgery.  Men may shave face and neck. ? Do not bring valuables to the hospital. ? [facility] is not responsible for any belongings or valuables. ? ?Contacts, dentures or bridgework may not be worn into surgery.  Leave your suitcase in the car.  After surgery it may be brought to your room. ? ?For patients admitted to the hospital, discharge time will be determined by your treatment team. ? ?Patients discharged the day of surgery will not be allowed to drive home and must have someone with them for 24 hours.  ? ? ?Special instructions:   DO NOT smoke tobacco or vape for 24 hours before your procedure. ? ?Please read over the following fact sheets that you were given. ?Coughing and Deep Breathing, Surgical Site Infection Prevention, Anesthesia Post-op Instructions, and Care and Recovery After Surgery ?  ? ? ? Transrectal Ultrasound-Guided Prostate Gold Seed Placement, Care After ?The following information offers guidance on how to care for yourself after your procedure. Your health care provider may also give you more specific instructions. If you have problems or questions, contact your health care provider. ?What can I expect after the procedure? ?After the procedure, it is common to have: ?Light bleeding from the rectum. ?Bruising or tenderness in the area behind the scrotum (perineum), if the needle was put into your prostate through this area. ?Small amounts  of blood in your urine. This should only last for a few days. ?Light brown or red semen. This may last for a couple of weeks. ?Follow these instructions at home: ?Medicines ? ?Take over-the-counter and prescription medicines only as told by your health care provider. ?If you were prescribed an antibiotic, take it as told by your health care provider. Do not stop taking the antibiotic early, even if you start to feel better. ?Ask your health care provider if the medicine prescribed to you requires you to avoid driving or using machinery. ?Eating and drinking ?Follow instructions from your health care provider about eating or drinking restrictions. ?Drink enough fluid to keep your urine pale yellow. ?Managing pain and swelling ?If directed, put ice on the affected area. To do this: ?Put ice in a plastic bag. ?Place a towel between your skin and the bag. ?Leave the ice on for 20 minutes, 2-3 times a day. ?Be sure to remove the ice if your skin turns bright red. If you cannot feel pain, heat, or cold, you have a greater risk of damage to the area. ?Try not to sit directly on the area behind the scrotum. A soft cushion can help with discomfort. ?Activity ?If you were given a sedative during the procedure, it can affect you for several hours. Do not drive or operate machinery until your health care provider says that it is safe. ?  Return to your normal activities as told by your health care provider. Ask your health care provider what activities are safe for you. ?Follow instructions from your health care provider about when it is safe for you to engage in sexual activity. ?General instructions ?Plan to have a responsible adult care for you for the time you are told after you leave the hospital or clinic. ?Do not take baths, swim, or use a hot tub until your health care provider approves. Ask your health care provider if you may take showers. You may only be allowed to take sponge baths. ?Keep all follow-up  visits. ?Contact a health care provider if: ?You have a fever or chills. ?You have more blood in your urine. ?You have blood in your urine for more than 2-3 days after the procedure. ?You have trouble passing urine or having a bowel movement. ?You have pain or burning when urinating. ?You have nausea or you vomit. ?Get help right away if: ?You have severe pain that does not get better with medicine. ?Your urine is bright red. ?You cannot urinate. ?You have rectal bleeding that gets worse. ?You have shortness of breath. ?Summary ?After the procedure, you may have blood in your urine and light bleeding from the rectum. ?Return to your normal activities as told by your health care provider. Ask your health care provider what activities are safe for you. ?Take over-the-counter and prescription medicines only as told by your health care provider. ?Contact your health care provider right away if your urine is bright red or you cannot pass urine. ?This information is not intended to replace advice given to you by your health care provider. Make sure you discuss any questions you have with your health care provider. ?Document Revised: 04/19/2020 Document Reviewed: 04/19/2020 ?Elsevier Patient Education ? [place]. ?General Anesthesia, Adult, Care After ?This sheet gives you information about how to care for yourself after your procedure. Your health care provider may also give you more specific instructions. If you have problems or questions, contact your health care provider. ?What can I expect after the procedure? ?After the procedure, the following side effects are common: ?Pain or discomfort at the IV site. ?Nausea. ?Vomiting. ?Sore throat. ?Trouble concentrating. ?Feeling cold or chills. ?Feeling weak or tired. ?Sleepiness and fatigue. ?Soreness and body aches. These side effects can affect parts of the body that were not involved in surgery. ?Follow these instructions at home: ?For the time period you were  told by your health care provider: ? ?Rest. ?Do not participate in activities where you could fall or become injured. ?Do not drive or use machinery. ?Do not drink alcohol. ?Do not take sleeping pills or medicines that cause drowsiness. ?Do not make important decisions or sign legal documents. ?Do not take care of children on your own. ?Eating and drinking ?Follow any instructions from your health care provider about eating or drinking restrictions. ?When you feel hungry, start by eating small amounts of foods that are soft and easy to digest (bland), such as toast. Gradually return to your regular diet. ?Drink enough fluid to keep your urine pale yellow. ?If you vomit, rehydrate by drinking water, juice, or clear broth. ?General instructions ?If you have sleep apnea, surgery and certain medicines can increase your risk for breathing problems. Follow instructions from your health care provider about wearing your sleep device: ?Anytime you are sleeping, including during daytime naps. ?While taking prescription pain medicines, sleeping medicines, or medicines that make you drowsy. ?Have a responsible adult  stay with you for the time you are told. It is important to have someone help care for you until you are awake and alert. ?Return to your normal activities as told by your health care provider. Ask your health care provider what activities are safe for you. ?Take over-the-counter and prescription medicines only as told by your health care provider. ?If you smoke, do not smoke without supervision. ?Keep all follow-up visits as told by your health care provider. This is important. ?Contact a health care provider if: ?You have nausea or vomiting that does not get better with medicine. ?You cannot eat or drink without vomiting. ?You have pain that does not get better with medicine. ?You are unable to pass urine. ?You develop a skin rash. ?You have a fever. ?You have redness around your IV site that gets worse. ?Get help  right away if: ?You have difficulty breathing. ?You have chest pain. ?You have blood in your urine or stool, or you vomit blood. ?Summary ?After the procedure, it is common to have a sore throat or nausea. It is als

## 2021-05-21 ENCOUNTER — Ambulatory Visit (INDEPENDENT_AMBULATORY_CARE_PROVIDER_SITE_OTHER): Payer: BC Managed Care – PPO | Admitting: Family Medicine

## 2021-05-21 ENCOUNTER — Encounter: Payer: Self-pay | Admitting: Family Medicine

## 2021-05-21 VITALS — BP 120/73 | HR 75 | Temp 96.5°F | Ht 70.5 in | Wt 203.2 lb

## 2021-05-21 DIAGNOSIS — I10 Essential (primary) hypertension: Secondary | ICD-10-CM | POA: Diagnosis not present

## 2021-05-21 DIAGNOSIS — E782 Mixed hyperlipidemia: Secondary | ICD-10-CM | POA: Diagnosis not present

## 2021-05-21 NOTE — Progress Notes (Signed)
? ?Subjective:  ?Patient ID: Edward Taylor, male    DOB: [birth year]  Age: 66 y.o. MRN: [id number] ? ?CC: Medical Management of Chronic Issues ? ? ?HPI ?[name] presents for  follow-up of hypertension. Patient has no history of headache chest pain or shortness of breath or recent cough. Patient also denies symptoms of TIA such as focal numbness or weakness. Patient denies side effects from medication. States taking it regularly. ? ?Hot flashes bothersome at night. Not sure Megesterol from Dr. Alyson Ingles may help some. Having a procedure to insert a spacer between prostate and rectum. Will start radiation on May 1.   ? ? ?History ?Rylin has a past medical history of Allergy, Basal cell carcinoma, GERD (gastroesophageal reflux disease), Hyperlipidemia, Hypertension, and Prostate cancer ([place]).  ? ?He has no past surgical history on file.  ? ?His family history includes Arthritis in his father; Cancer in his mother; Diabetes in his father; Heart disease in his maternal grandfather and paternal grandfather; Hyperlipidemia in his father; Hypertension in his father and mother; Stroke in his father.He reports that he has never smoked. He has never used smokeless tobacco. He reports current alcohol use of about 4.0 standard drinks per week. He reports that he does not use drugs. ? ?Current Outpatient Medications on File Prior to Visit  ?Medication Sig Dispense Refill  ? cetirizine (ZYRTEC) 10 MG tablet Take 10 mg by mouth daily.    ? Cholecalciferol (VITAMIN D) 125 MCG (5000 UT) CAPS Take 5,000 Units by mouth daily.    ? famotidine (PEPCID) 40 MG tablet TAKE 1 TABLET DAILY 90 tablet 3  ? fexofenadine-pseudoephedrine (ALLEGRA-D) 60-120 MG 12 hr tablet Take 1 tablet by mouth daily as needed (allergies/congestion).    ? fluticasone (FLONASE) 50 MCG/ACT nasal spray Place 1 spray into both nostrils daily. (Patient taking differently: Place 1 spray into both nostrils at bedtime.) 150 g 1  ? lisinopril (ZESTRIL) 20 MG  tablet TAKE 1 TABLET DAILY 90 tablet 3  ? megestrol (MEGACE) 20 MG tablet Take 1 tablet (20 mg total) by mouth 2 (two) times daily as needed. 60 tablet 3  ? meloxicam (MOBIC) 15 MG tablet Take 15 mg by mouth daily as needed for pain.    ? metoprolol succinate (TOPROL-XL) 100 MG 24 hr tablet Take 1 tablet (100 mg total) by mouth daily. Take with or immediately following a meal. 90 tablet 3  ? Relugolix (ORGOVYX) 120 MG TABS Take 1 capsule by mouth daily. 30 tablet 4  ? sildenafil (REVATIO) 20 MG tablet TAKE 2-5 PILLS AT ONCE BY MOUTH WITH EACH SEXUAL ENCOUNTER 100 tablet 3  ? simvastatin (ZOCOR) 40 MG tablet TAKE 1 TABLET DAILY AT     6:00PM 90 tablet 3  ? trimethoprim-polymyxin b (POLYTRIM) ophthalmic solution Place 1 drop into both eyes daily as needed for irritation.    ? Turmeric 500 MG CAPS Take 500 mg by mouth daily.    ? zinc gluconate 50 MG tablet Take 50 mg by mouth daily.    ? ?No current facility-administered medications on file prior to visit.  ? ? ?ROS ?Review of Systems  ?Constitutional:  Negative for fever.  ?Respiratory:  Negative for shortness of breath.   ?Cardiovascular:  Negative for chest pain.  ?Musculoskeletal:  Negative for arthralgias.  ?Skin:  Negative for rash.  ? ?Objective:  ?BP 120/73   Pulse 75   Temp (!) 96.5 ?F (35.8 ?C)   Ht 5' 10.5" (1.791 m)   Wt 203 lb  3.2 oz (92.2 kg)   SpO2 97%   BMI 28.74 kg/m?  ? ?BP Readings from Last 3 Encounters:  ?05/21/21 120/73  ?05/11/21 127/73  ?04/04/21 130/63  ? ? ?Wt Readings from Last 3 Encounters:  ?05/21/21 203 lb 3.2 oz (92.2 kg)  ?04/04/21 207 lb 3.2 oz (94 kg)  ?04/02/21 209 lb 6.4 oz (95 kg)  ? ? ? ?Physical Exam ?Vitals reviewed.  ?Constitutional:   ?   Appearance: He is well-developed.  ?HENT:  ?   Head: Normocephalic and atraumatic.  ?   Right Ear: External ear normal.  ?   Left Ear: External ear normal.  ?   Mouth/Throat:  ?   Pharynx: No oropharyngeal exudate or posterior oropharyngeal erythema.  ?Eyes:  ?   Pupils: Pupils are  equal, round, and reactive to light.  ?Cardiovascular:  ?   Rate and Rhythm: Normal rate and regular rhythm.  ?   Heart sounds: No murmur heard. ?Pulmonary:  ?   Effort: No respiratory distress.  ?   Breath sounds: Normal breath sounds.  ?Musculoskeletal:  ?   Cervical back: Normal range of motion and neck supple.  ?Neurological:  ?   Mental Status: He is alert and oriented to person, place, and time.  ? ? ? ? ?Assessment & Plan:  ? ?Cottrell was seen today for medical management of chronic issues. ? ?Diagnoses and all orders for this visit: ? ?Mixed hyperlipidemia ?-     Cancel: Lipid panel ? ?Essential hypertension ?-     Cancel: CBC with Differential/Platelet ?-     Cancel: CMP14+EGFR ? ? ?Allergies as of 05/21/2021   ?No Known Allergies ?  ? ?  ?Medication List  ?  ? ?  ? Accurate as of May 21, 2021  8:40 AM. If you have any questions, ask your nurse or doctor.  ?  ?  ? ?  ? ?cetirizine 10 MG tablet ?Commonly known as: ZYRTEC ?Take 10 mg by mouth daily. ?  ?famotidine 40 MG tablet ?Commonly known as: PEPCID ?TAKE 1 TABLET DAILY ?  ?fexofenadine-pseudoephedrine 60-120 MG 12 hr tablet ?Commonly known as: ALLEGRA-D ?Take 1 tablet by mouth daily as needed (allergies/congestion). ?  ?fluticasone 50 MCG/ACT nasal spray ?Commonly known as: FLONASE ?Place 1 spray into both nostrils daily. ?What changed: when to take this ?  ?lisinopril 20 MG tablet ?Commonly known as: ZESTRIL ?TAKE 1 TABLET DAILY ?  ?megestrol 20 MG tablet ?Commonly known as: MEGACE ?Take 1 tablet (20 mg total) by mouth 2 (two) times daily as needed. ?  ?meloxicam 15 MG tablet ?Commonly known as: MOBIC ?Take 15 mg by mouth daily as needed for pain. ?  ?metoprolol succinate 100 MG 24 hr tablet ?Commonly known as: TOPROL-XL ?Take 1 tablet (100 mg total) by mouth daily. Take with or immediately following a meal. ?  ?Orgovyx 120 MG Tabs ?Generic drug: Relugolix ?Take 1 capsule by mouth daily. ?  ?sildenafil 20 MG tablet ?Commonly known as: REVATIO ?TAKE 2-5  PILLS AT ONCE BY MOUTH WITH EACH SEXUAL ENCOUNTER ?  ?simvastatin 40 MG tablet ?Commonly known as: ZOCOR ?TAKE 1 TABLET DAILY AT     6:00PM ?  ?trimethoprim-polymyxin b ophthalmic solution ?Commonly known as: POLYTRIM ?Place 1 drop into both eyes daily as needed for irritation. ?  ?Turmeric 500 MG Caps ?Take 500 mg by mouth daily. ?  ?Vitamin D 125 MCG (5000 UT) Caps ?Take 5,000 Units by mouth daily. ?  ?zinc gluconate 50 MG tablet ?  Take 50 mg by mouth daily. ?  ? ?  ? ? ?No orders of the defined types were placed in this encounter. ? ? ?Withheld blood work at pt. Request due to poor veins, frequent draws by cancer MD ? ?Follow-up: No follow-ups on file. ? ?Claretta Fraise, M.D. ?

## 2021-05-22 ENCOUNTER — Other Ambulatory Visit: Payer: Self-pay

## 2021-05-22 ENCOUNTER — Encounter (HOSPITAL_COMMUNITY)
Admission: RE | Admit: 2021-05-22 | Discharge: 2021-05-22 | Disposition: A | Discharge status: Home / Self Care | Payer: BC Managed Care – PPO | Source: Ambulatory Visit | Attending: Urology | Admitting: Urology

## 2021-05-22 ENCOUNTER — Encounter (HOSPITAL_COMMUNITY): Payer: Self-pay

## 2021-05-22 DIAGNOSIS — Z01810 Encounter for preprocedural cardiovascular examination: Secondary | ICD-10-CM | POA: Insufficient documentation

## 2021-05-22 DIAGNOSIS — C61 Malignant neoplasm of prostate: Secondary | ICD-10-CM | POA: Diagnosis not present

## 2021-05-22 DIAGNOSIS — I10 Essential (primary) hypertension: Secondary | ICD-10-CM | POA: Diagnosis not present

## 2021-05-22 DIAGNOSIS — K219 Gastro-esophageal reflux disease without esophagitis: Secondary | ICD-10-CM | POA: Diagnosis not present

## 2021-05-24 ENCOUNTER — Ambulatory Visit (HOSPITAL_COMMUNITY): Payer: BC Managed Care – PPO | Admitting: Certified Registered Nurse Anesthetist

## 2021-05-24 ENCOUNTER — Encounter (HOSPITAL_COMMUNITY): Payer: Self-pay | Admitting: Urology

## 2021-05-24 ENCOUNTER — Ambulatory Visit (HOSPITAL_COMMUNITY)
Admission: RE | Admit: 2021-05-24 | Discharge: 2021-05-24 | Disposition: A | Discharge status: Home / Self Care | Payer: BC Managed Care – PPO | Attending: Urology | Admitting: Urology

## 2021-05-24 ENCOUNTER — Encounter (HOSPITAL_COMMUNITY)
Admission: RE | Disposition: A | Discharge status: Home / Self Care | Payer: Self-pay | Source: Home / Self Care | Attending: Urology

## 2021-05-24 ENCOUNTER — Other Ambulatory Visit: Payer: Self-pay

## 2021-05-24 ENCOUNTER — Ambulatory Visit (HOSPITAL_COMMUNITY): Payer: BC Managed Care – PPO

## 2021-05-24 DIAGNOSIS — C61 Malignant neoplasm of prostate: Secondary | ICD-10-CM | POA: Diagnosis not present

## 2021-05-24 DIAGNOSIS — K219 Gastro-esophageal reflux disease without esophagitis: Secondary | ICD-10-CM | POA: Diagnosis not present

## 2021-05-24 DIAGNOSIS — I10 Essential (primary) hypertension: Secondary | ICD-10-CM | POA: Diagnosis not present

## 2021-05-24 HISTORY — PX: GOLD SEED IMPLANT: SHX6343

## 2021-05-24 HISTORY — PX: SPACE OAR INSTILLATION: SHX6769

## 2021-05-24 SURGERY — INSERTION, GOLD SEEDS
Anesthesia: General | Site: Prostate

## 2021-05-24 MED ORDER — CEFAZOLIN SODIUM-DEXTROSE 2-4 GM/100ML-% IV SOLN
2.0000 g | INTRAVENOUS | Status: AC
  Administered 2021-05-24: 2 g via INTRAVENOUS
  Filled 2021-05-24: qty 100

## 2021-05-24 MED ORDER — LACTATED RINGERS IV SOLN: INTRAVENOUS | Status: DC | PRN

## 2021-05-24 MED ORDER — MIDAZOLAM HCL 5 MG/5ML IJ SOLN
INTRAMUSCULAR | Status: DC | PRN
  Administered 2021-05-24: 2 mg via INTRAVENOUS

## 2021-05-24 MED ORDER — LIDOCAINE 2% (20 MG/ML) 5 ML SYRINGE
INTRAMUSCULAR | Status: DC | PRN
  Administered 2021-05-24: 60 mg via INTRAVENOUS

## 2021-05-24 MED ORDER — MIDAZOLAM HCL 2 MG/2ML IJ SOLN
INTRAMUSCULAR | Status: AC
  Filled 2021-05-24: qty 2

## 2021-05-24 MED ORDER — ONDANSETRON HCL 4 MG/2ML IJ SOLN
INTRAMUSCULAR | Status: AC
Start: 2021-05-24 — End: ?
  Filled 2021-05-24: qty 2

## 2021-05-24 MED ORDER — LACTATED RINGERS IV SOLN: INTRAVENOUS | Status: DC

## 2021-05-24 MED ORDER — PROPOFOL 10 MG/ML IV BOLUS
INTRAVENOUS | Status: DC | PRN
  Administered 2021-05-24: 200 mg via INTRAVENOUS

## 2021-05-24 MED ORDER — STERILE WATER FOR IRRIGATION IR SOLN
Status: DC | PRN
  Administered 2021-05-24: 500 mL

## 2021-05-24 MED ORDER — PHENYLEPHRINE 80 MCG/ML (10ML) SYRINGE FOR IV PUSH (FOR BLOOD PRESSURE SUPPORT)
PREFILLED_SYRINGE | INTRAVENOUS | Status: AC
  Filled 2021-05-24: qty 10

## 2021-05-24 MED ORDER — FENTANYL CITRATE (PF) 250 MCG/5ML IJ SOLN
INTRAMUSCULAR | Status: DC | PRN
  Administered 2021-05-24 (×2): 50 ug via INTRAVENOUS

## 2021-05-24 MED ORDER — KETOROLAC TROMETHAMINE 15 MG/ML IJ SOLN
INTRAMUSCULAR | Status: DC | PRN
  Administered 2021-05-24: 15 mg via INTRAVENOUS

## 2021-05-24 MED ORDER — CHLORHEXIDINE GLUCONATE 0.12 % MT SOLN: 15.0000 mL | Freq: Once | OROMUCOSAL | Status: DC

## 2021-05-24 MED ORDER — ORAL CARE MOUTH RINSE: 15.0000 mL | Freq: Once | OROMUCOSAL | Status: DC

## 2021-05-24 MED ORDER — SODIUM CHLORIDE (PF) 0.9 % IJ SOLN
INTRAMUSCULAR | Status: DC | PRN
  Administered 2021-05-24: 10 mL via INTRAVENOUS

## 2021-05-24 MED ORDER — LIDOCAINE HCL (PF) 2 % IJ SOLN
INTRAMUSCULAR | Status: AC
  Filled 2021-05-24: qty 5

## 2021-05-24 MED ORDER — PROPOFOL 10 MG/ML IV BOLUS
INTRAVENOUS | Status: AC
  Filled 2021-05-24: qty 20

## 2021-05-24 MED ORDER — PHENYLEPHRINE HCL (PRESSORS) 10 MG/ML IV SOLN
INTRAVENOUS | Status: DC | PRN
  Administered 2021-05-24 (×3): 40 ug via INTRAVENOUS

## 2021-05-24 MED ORDER — ONDANSETRON HCL 4 MG/2ML IJ SOLN
INTRAMUSCULAR | Status: DC | PRN
  Administered 2021-05-24: 4 mg via INTRAVENOUS

## 2021-05-24 MED ORDER — SUGAMMADEX SODIUM 500 MG/5ML IV SOLN
INTRAVENOUS | Status: AC
  Filled 2021-05-24: qty 5

## 2021-05-24 MED ORDER — KETOROLAC TROMETHAMINE 30 MG/ML IJ SOLN
INTRAMUSCULAR | Status: AC
  Filled 2021-05-24: qty 1

## 2021-05-24 MED ORDER — SODIUM CHLORIDE (PF) 0.9 % IJ SOLN
INTRAMUSCULAR | Status: AC
  Filled 2021-05-24: qty 10

## 2021-05-24 MED ORDER — TRAMADOL HCL 50 MG PO TABS: 50.0000 mg | ORAL_TABLET | Freq: Four times a day (QID) | ORAL | 0 refills | Status: DC | PRN

## 2021-05-24 MED ORDER — FENTANYL CITRATE (PF) 100 MCG/2ML IJ SOLN
INTRAMUSCULAR | Status: AC
Start: 2021-05-24 — End: ?
  Filled 2021-05-24: qty 2

## 2021-05-24 MED ORDER — ONDANSETRON HCL 4 MG/2ML IJ SOLN: 4.0000 mg | Freq: Once | INTRAMUSCULAR | Status: DC | PRN

## 2021-05-24 MED ORDER — FLEET ENEMA 7-19 GM/118ML RE ENEM
1.0000 | ENEMA | Freq: Once | RECTAL | Status: AC
  Administered 2021-05-24: 1 via RECTAL
  Filled 2021-05-24: qty 1

## 2021-05-24 MED ORDER — ROCURONIUM BROMIDE 10 MG/ML (PF) SYRINGE
PREFILLED_SYRINGE | INTRAVENOUS | Status: DC | PRN
  Administered 2021-05-24: 70 mg via INTRAVENOUS

## 2021-05-24 MED ORDER — ROCURONIUM BROMIDE 10 MG/ML (PF) SYRINGE
PREFILLED_SYRINGE | INTRAVENOUS | Status: AC
Start: 2021-05-24 — End: ?
  Filled 2021-05-24: qty 10

## 2021-05-24 MED ORDER — SUGAMMADEX SODIUM 200 MG/2ML IV SOLN
INTRAVENOUS | Status: DC | PRN
Start: 2021-05-24 — End: 2021-05-24
  Administered 2021-05-24: 368 mg via INTRAVENOUS

## 2021-05-24 MED ORDER — FENTANYL CITRATE PF 50 MCG/ML IJ SOSY: 25.0000 ug | PREFILLED_SYRINGE | INTRAMUSCULAR | Status: DC | PRN

## 2021-05-24 SURGICAL SUPPLY — 31 items
COVER BACK TABLE 60X90IN (DRAPES) ×3 IMPLANT
DRAPE EENT ADH APERT 15X15 STR (DRAPES) ×1 IMPLANT
DRAPE EENT ADH APERT 31X51 STR (DRAPES) ×2 IMPLANT
DRAPE LEGGINS SURG 28X43 STRL (DRAPES) ×3 IMPLANT
DRSG TEGADERM 4X10 (GAUZE/BANDAGES/DRESSINGS) IMPLANT
DRSG TEGADERM 8X12 (GAUZE/BANDAGES/DRESSINGS) ×2 IMPLANT
GAUZE SPONGE 4X4 12PLY STRL (GAUZE/BANDAGES/DRESSINGS) ×6 IMPLANT
GLOVE BIO SURGEON STRL SZ8 (GLOVE) ×3 IMPLANT
GLOVE BIOGEL PI IND STRL 7.0 (GLOVE) ×4 IMPLANT
GLOVE BIOGEL PI IND STRL 8 (GLOVE) ×1 IMPLANT
GLOVE BIOGEL PI INDICATOR 7.0 (GLOVE) ×1
GLOVE BIOGEL PI INDICATOR 8 (GLOVE) ×1
GOWN STRL REUS W/TWL LRG LVL3 (GOWN DISPOSABLE) ×2 IMPLANT
GOWN STRL REUS W/TWL XL LVL3 (GOWN DISPOSABLE) ×4 IMPLANT
IMPL SPACEOAR SYSTEM 10ML (Spacer) ×2 IMPLANT
IMPLANT SPACEOAR SYSTEM 10ML (Spacer) ×2 IMPLANT
KIT TURNOVER CYSTO (KITS) ×2 IMPLANT
MARKER GOLD PRELOAD 1.2X3 (Urological Implant) ×1 IMPLANT
MARKER SKIN DUAL TIP RULER LAB (MISCELLANEOUS) ×2 IMPLANT
NDL HYPO 18GX1.5 BLUNT FILL (NEEDLE) ×1 IMPLANT
NEEDLE HYPO 18GX1.5 BLUNT FILL (NEEDLE) ×2 IMPLANT
NS IRRIG 1000ML POUR BTL (IV SOLUTION) ×2 IMPLANT
PAD ARMBOARD 7.5X6 YLW CONV (MISCELLANEOUS) ×3 IMPLANT
SEED GOLD PRELOAD 1.2X3 (Urological Implant) ×6 IMPLANT
SOL PREP POV-IOD 4OZ 10% (MISCELLANEOUS) ×2 IMPLANT
SURGILUBE 2OZ TUBE FLIPTOP (MISCELLANEOUS) ×2 IMPLANT
SYR 10ML LL (SYRINGE) ×2 IMPLANT
SYR CONTROL 10ML LL (SYRINGE) ×2 IMPLANT
TOWEL NATURAL 4PK STERILE (DISPOSABLE) ×3 IMPLANT
UNDERPAD 30X36 HEAVY ABSORB (UNDERPADS AND DIAPERS) ×2 IMPLANT
WATER STERILE IRR 500ML POUR (IV SOLUTION) ×3 IMPLANT

## 2021-05-24 NOTE — Anesthesia Procedure Notes (Signed)
Procedure Name: Intubation ?Date/Time: 05/24/2021 8:02 AM ?Performed by: Maude Leriche, CRNA ?Pre-anesthesia Checklist: Patient identified, Emergency Drugs available, Suction available and Patient being monitored ?Patient Re-evaluated:Patient Re-evaluated prior to induction ?Oxygen Delivery Method: Circle system utilized ?Preoxygenation: Pre-oxygenation with 100% oxygen ?Induction Type: IV induction ?Ventilation: Two handed mask ventilation required ?Laryngoscope Size: Sabra Heck and 2 ?Grade View: Grade II ?Tube type: Oral ?Tube size: 7.5 mm ?Number of attempts: 1 ?Airway Equipment and Method: Stylet, Oral airway and Bite block ?Placement Confirmation: ETT inserted through vocal cords under direct vision, positive ETCO2 and breath sounds checked- equal and bilateral ?Secured at: 23 cm ?Tube secured with: Tape ?Dental Injury: Teeth and Oropharynx as per pre-operative assessment  ? ? ? ? ?

## 2021-05-24 NOTE — Anesthesia Postprocedure Evaluation (Signed)
Anesthesia Post Note ? ?Patient: Edward Taylor ? ?Procedure(s) Performed: GOLD SEED IMPLANT (Prostate) ?SPACE OAR INSTILLATION (Prostate) ? ?Patient location during evaluation: Phase II ?Anesthesia Type: General ?Level of consciousness: awake ?Pain management: pain level controlled ?Vital Signs Assessment: post-procedure vital signs reviewed and stable ?Respiratory status: spontaneous breathing and respiratory function stable ?Cardiovascular status: blood pressure returned to baseline and stable ?Postop Assessment: no headache and no apparent nausea or vomiting ?Anesthetic complications: no ?Comments: Late entry ? ? ?No notable events documented. ? ? ?Last Vitals:  ?Vitals:  ? 05/24/21 0857 05/24/21 0909  ?BP:  133/75  ?Pulse: 79 77  ?Resp: 19 18  ?Temp:  36.5 ?C  ?SpO2: 100% 97%  ?  ?Last Pain:  ?Vitals:  ? 05/24/21 0909  ?TempSrc: Oral  ?PainSc: 0-No pain  ? ? ?  ?  ?  ?  ?  ?  ? ?Louann Sjogren ? ? ? ? ?

## 2021-05-24 NOTE — Interval H&P Note (Signed)
History and Physical Interval Note: ? ?05/24/2021 ?7:38 AM ? ?Edward Taylor  has presented today for surgery, with the diagnosis of Prostate Cancer.  The various methods of treatment have been discussed with the patient and family. After consideration of risks, benefits and other options for treatment, the patient has consented to  Procedure(s): ?GOLD SEED IMPLANT (N/A) as a surgical intervention.  The patient's history has been reviewed, patient examined, no change in status, stable for surgery.  I have reviewed the patient's chart and labs.  Questions were answered to the patient's satisfaction.   ? ? ?Edward Taylor ? ? ?

## 2021-05-24 NOTE — Transfer of Care (Signed)
Immediate Anesthesia Transfer of Care Note ? ?Patient: Edward Taylor ? ?Procedure(s) Performed: GOLD SEED IMPLANT (Prostate) ?SPACE OAR INSTILLATION (Prostate) ? ?Patient Location: PACU ? ?Anesthesia Type:General ? ?Level of Consciousness: awake, alert  and oriented ? ?Airway & Oxygen Therapy: Patient Spontanous Breathing and Patient connected to face mask oxygen ? ?Post-op Assessment: Report given to RN, Post -op Vital signs reviewed and stable, Patient moving all extremities X 4 and Patient able to stick tongue midline ? ?Post vital signs: Reviewed ? ?Last Vitals:  ?Vitals Value Taken Time  ?BP 131/67 05/24/21 0833  ?Temp 97.7   ?Pulse 70 05/24/21 0834  ?Resp 15 05/24/21 0834  ?SpO2 100 % 05/24/21 0834  ?Vitals shown include unvalidated device data. ? ?Last Pain:  ?Vitals:  ? 05/24/21 0711  ?TempSrc: Oral  ?PainSc: 0-No pain  ?   ? ?  ? ?Complications: No notable events documented. ?

## 2021-05-24 NOTE — Anesthesia Preprocedure Evaluation (Signed)
Anesthesia Evaluation  ?Patient identified by MRN, date of birth, ID band ?Patient awake ? ? ? ?Reviewed: ?Allergy & Precautions, H&P , NPO status , Patient's Chart, lab work & pertinent test results, reviewed documented beta blocker date and time  ? ?Airway ?Mallampati: II ? ?TM Distance: >3 FB ?Neck ROM: full ? ? ? Dental ?no notable dental hx. ? ?  ?Pulmonary ?neg pulmonary ROS,  ?  ?Pulmonary exam normal ?breath sounds clear to auscultation ? ? ? ? ? ? Cardiovascular ?Exercise Tolerance: Good ?hypertension, negative cardio ROS ? ? ?Rhythm:regular Rate:Normal ? ? ?  ?Neuro/Psych ?negative neurological ROS ? negative psych ROS  ? GI/Hepatic ?Neg liver ROS, GERD  Medicated,  ?Endo/Other  ?negative endocrine ROS ? Renal/GU ?negative Renal ROS  ?negative genitourinary ?  ?Musculoskeletal ? ? Abdominal ?  ?Peds ? Hematology ?negative hematology ROS ?(+)   ?Anesthesia Other Findings ? ? Reproductive/Obstetrics ?negative OB ROS ? ?  ? ? ? ? ? ? ? ? ? ? ? ? ? ?  ?  ? ? ? ? ? ? ? ? ?Anesthesia Physical ?Anesthesia Plan ? ?ASA: 2 ? ?Anesthesia Plan: General and General LMA  ? ?Post-op Pain Management:   ? ?Induction:  ? ?PONV Risk Score and Plan:  ? ?Airway Management Planned:  ? ?Additional Equipment:  ? ?Intra-op Plan:  ? ?Post-operative Plan:  ? ?Informed Consent: I have reviewed the patients History and Physical, chart, labs and discussed the procedure including the risks, benefits and alternatives for the proposed anesthesia with the patient or authorized representative who has indicated his/her understanding and acceptance.  ? ? ? ?Dental Advisory Given ? ?Plan Discussed with: CRNA ? ?Anesthesia Plan Comments:   ? ? ? ? ? ? ?Anesthesia Quick Evaluation ? ?

## 2021-05-24 NOTE — Op Note (Signed)
PRE-OPERATIVE DIAGNOSIS:  Adenocarcinoma of the prostate ? ?POST-OPERATIVE DIAGNOSIS:  Same ? ?PROCEDURE: 1. Prostate Ultrasound ?2. Placement of fiducial marks ?3. Placement of SpaceOAR ? ?SURGEON:  Surgeon(s): ?[name], MD ? ?ANESTHESIA:  General ? ?EBL:  Minimal ? ?DRAINS: none ? ?FINDINGS: no hyperechoic/hypoechoic lesions in the prostate. 23.8cc prostate on ultrasound ? ?INDICATION: Mr Edward Taylor is a 66 year old with a history of T1c prostate cancer who is scheduled to undergo IMRT. He wishes to have fiducial markers and SpaceOAR placed prior to IMRT to decrease rectal toxicity. ? ?Description of procedure: After informed consent the patient was brought to the major OR, placed on the table and administered general anesthesia. He was then moved to the modified lithotomy position with his perineum perpendicular to the floor. His perineum and genitalia were then sterilely prepped. An official timeout was then performed. The transrectal ultrasound probe was placed in the rectum and affixed to the stand. He was then sterilely draped. ? ?A transrectal ultrasound of the prostate was performed.  Lidocaine  was not  instilled using ultrasound guidance into the junction of each seminal vesicle of the prostate. ? ?3 Gold markers were placed into the prostate using the standard template and ultrasound guidance.  Accurate placement of the markers was confirmed. ? ?We then proceeded to mix the SpaceOAR using the kit supplied from the manufacturer. Once this was complete we placed a sinal needle into the perirectal fat between the rectum and the prostate. Once this was accomplished we injected 2cc of normal saline to hydrodissect the plain. We then instilled the the SpaceOAR through the spinal needle and noted good distribution in the perirectal fat.  ? ?The patient was awakened and taken to recovery room in stable and satisfactory condition. He tolerated procedure well and there were no intraoperative  complications. ? ?CONDITION: Stable, extubated, transferred to PACU ? ?PLAN: The patient is to be discharged home and he will start IMRT in the next 2-3 weeks ? ?

## 2021-05-25 ENCOUNTER — Encounter (HOSPITAL_COMMUNITY): Payer: Self-pay | Admitting: Urology

## 2021-05-25 ENCOUNTER — Telehealth: Payer: Self-pay | Admitting: *Deleted

## 2021-05-25 NOTE — Telephone Encounter (Signed)
CALLED PATIENT TO REMIND OF SIM APPT. FOR 05-28-21- ARRIVAL TIME- 8:45 AM @ [facility], PATIENT INFORMED TO ARRIVE WITH A FULL BLADDER AND AN EMPTY BOWEL, SPOKE WITH PATIENT AND HE IS AWARE OF THIS APPT. AND THE INSTRUCTIONS ?

## 2021-05-27 NOTE — Progress Notes (Signed)
?  Radiation Oncology         [phone] ?________________________________ ? ?Name: Edward Taylor MRN: [id number]  ?Date: 05/28/2021  DOB: [birth year] ? ?SIMULATION AND TREATMENT PLANNING NOTE ? ?  ICD-10-CM   ?1. Malignant neoplasm of prostate ([place])  C61   ?  ? ? ?DIAGNOSIS:   66 y.o. 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 ? ?NARRATIVE:  The patient was brought to the [facility].  Identity was confirmed.  All relevant records and images related to the planned course of therapy were reviewed.  The patient freely provided informed written consent to proceed with treatment after reviewing the details related to the planned course of therapy. The consent form was witnessed and verified by the simulation staff.  Then, the patient was set-up in a stable reproducible supine position for radiation therapy.  A vacuum lock pillow device was custom fabricated to position his legs in a reproducible immobilized position.  Then, I performed a urethrogram under sterile conditions to identify the prostatic apex.  CT images were obtained.  Surface markings were placed.  The CT images were loaded into the planning software.  Then the prostate target and avoidance structures including the rectum, bladder, bowel and hips were contoured.  Treatment planning then occurred.  The radiation prescription was entered and confirmed.  A total of one complex treatment devices was fabricated. I have requested : Intensity Modulated Radiotherapy (IMRT) is medically necessary for this case for the following reason:  Rectal sparing.. ? ?PLAN:  The patient will receive 70 Gy in 28 fractions. ? ?________________________________ ? ?Sheral Apley Tammi Klippel, M.D. ? ?

## 2021-05-28 ENCOUNTER — Ambulatory Visit
Admission: RE | Admit: 2021-05-28 | Discharge: 2021-05-28 | Disposition: A | Discharge status: Home / Self Care | Payer: BC Managed Care – PPO | Source: Ambulatory Visit | Attending: Radiation Oncology | Admitting: Radiation Oncology

## 2021-05-28 ENCOUNTER — Encounter: Payer: Self-pay | Admitting: Urology

## 2021-05-28 ENCOUNTER — Other Ambulatory Visit: Payer: Self-pay

## 2021-05-28 DIAGNOSIS — Z191 Hormone sensitive malignancy status: Secondary | ICD-10-CM | POA: Diagnosis not present

## 2021-05-28 DIAGNOSIS — C61 Malignant neoplasm of prostate: Secondary | ICD-10-CM | POA: Insufficient documentation

## 2021-05-29 ENCOUNTER — Telehealth: Payer: Self-pay

## 2021-05-29 NOTE — Telephone Encounter (Signed)
PA started on covermymeds. Sent to plan.  ?

## 2021-05-29 NOTE — Telephone Encounter (Signed)
Edward Taylor from [place] called advising patients Or[employer] needs a prior auth she advised she started it on Cover My Meds she provided a code [id number] ? ?Will reach out to start PA on covermymeds ?

## 2021-05-30 NOTE — Telephone Encounter (Signed)
Received approval    [place] message sent to patient

## 2021-06-04 DIAGNOSIS — C61 Malignant neoplasm of prostate: Secondary | ICD-10-CM | POA: Insufficient documentation

## 2021-06-04 DIAGNOSIS — Z191 Hormone sensitive malignancy status: Secondary | ICD-10-CM | POA: Diagnosis not present

## 2021-06-06 ENCOUNTER — Ambulatory Visit
Admission: RE | Admit: 2021-06-06 | Discharge: 2021-06-06 | Disposition: A | Discharge status: Home / Self Care | Payer: BC Managed Care – PPO | Source: Ambulatory Visit | Attending: Radiation Oncology | Admitting: Radiation Oncology

## 2021-06-06 ENCOUNTER — Other Ambulatory Visit: Payer: Self-pay

## 2021-06-06 DIAGNOSIS — C61 Malignant neoplasm of prostate: Secondary | ICD-10-CM | POA: Diagnosis not present

## 2021-06-06 DIAGNOSIS — Z510 Encounter for antineoplastic radiation therapy: Secondary | ICD-10-CM | POA: Diagnosis not present

## 2021-06-06 DIAGNOSIS — Z191 Hormone sensitive malignancy status: Secondary | ICD-10-CM | POA: Diagnosis not present

## 2021-06-06 LAB — RAD ONC ARIA SESSION SUMMARY
Course Elapsed Days: 0
Plan Fractions Treated to Date: 1
Plan Prescribed Dose Per Fraction: 2.5 Gy
Plan Total Fractions Prescribed: 28
Plan Total Prescribed Dose: 70 Gy
Reference Point Dosage Given to Date: 2.5 Gy
Reference Point Session Dosage Given: 2.5 Gy
Session Number: 1

## 2021-06-07 ENCOUNTER — Other Ambulatory Visit: Payer: Self-pay

## 2021-06-07 ENCOUNTER — Ambulatory Visit
Admission: RE | Admit: 2021-06-07 | Discharge: 2021-06-07 | Disposition: A | Discharge status: Home / Self Care | Payer: BC Managed Care – PPO | Source: Ambulatory Visit | Attending: Radiation Oncology | Admitting: Radiation Oncology

## 2021-06-07 DIAGNOSIS — Z191 Hormone sensitive malignancy status: Secondary | ICD-10-CM | POA: Diagnosis not present

## 2021-06-07 DIAGNOSIS — C61 Malignant neoplasm of prostate: Secondary | ICD-10-CM | POA: Diagnosis not present

## 2021-06-07 DIAGNOSIS — Z510 Encounter for antineoplastic radiation therapy: Secondary | ICD-10-CM | POA: Diagnosis not present

## 2021-06-07 LAB — RAD ONC ARIA SESSION SUMMARY
Course Elapsed Days: 1
Plan Fractions Treated to Date: 2
Plan Prescribed Dose Per Fraction: 2.5 Gy
Plan Total Fractions Prescribed: 28
Plan Total Prescribed Dose: 70 Gy
Reference Point Dosage Given to Date: 5 Gy
Reference Point Session Dosage Given: 2.5 Gy
Session Number: 2

## 2021-06-08 ENCOUNTER — Ambulatory Visit (INDEPENDENT_AMBULATORY_CARE_PROVIDER_SITE_OTHER): Payer: BC Managed Care – PPO | Admitting: Urology

## 2021-06-08 ENCOUNTER — Other Ambulatory Visit: Payer: Self-pay

## 2021-06-08 ENCOUNTER — Ambulatory Visit
Admission: RE | Admit: 2021-06-08 | Discharge: 2021-06-08 | Disposition: A | Discharge status: Home / Self Care | Payer: BC Managed Care – PPO | Source: Ambulatory Visit | Attending: Radiation Oncology | Admitting: Radiation Oncology

## 2021-06-08 ENCOUNTER — Encounter: Payer: Self-pay | Admitting: Urology

## 2021-06-08 VITALS — BP 144/77 | HR 86

## 2021-06-08 DIAGNOSIS — C61 Malignant neoplasm of prostate: Secondary | ICD-10-CM | POA: Diagnosis not present

## 2021-06-08 DIAGNOSIS — Z191 Hormone sensitive malignancy status: Secondary | ICD-10-CM | POA: Diagnosis not present

## 2021-06-08 DIAGNOSIS — Z510 Encounter for antineoplastic radiation therapy: Secondary | ICD-10-CM | POA: Diagnosis not present

## 2021-06-08 LAB — URINALYSIS, ROUTINE W REFLEX MICROSCOPIC
Bilirubin, UA: NEGATIVE
Glucose, UA: NEGATIVE
Ketones, UA: NEGATIVE
Leukocytes,UA: NEGATIVE
Nitrite, UA: NEGATIVE
Protein,UA: NEGATIVE
RBC, UA: NEGATIVE
Specific Gravity, UA: 1.025 (ref 1.005–1.030)
Urobilinogen, Ur: 0.2 mg/dL (ref 0.2–1.0)
pH, UA: 6 (ref 5.0–7.5)

## 2021-06-08 LAB — RAD ONC ARIA SESSION SUMMARY
Course Elapsed Days: 2
Plan Fractions Treated to Date: 3
Plan Prescribed Dose Per Fraction: 2.5 Gy
Plan Total Fractions Prescribed: 28
Plan Total Prescribed Dose: 70 Gy
Reference Point Dosage Given to Date: 7.5 Gy
Reference Point Session Dosage Given: 2.5 Gy
Session Number: 3

## 2021-06-08 MED ORDER — MEGESTROL ACETATE 20 MG PO TABS
20.0000 mg | ORAL_TABLET | Freq: Two times a day (BID) | ORAL | 3 refills | Status: DC | PRN
Start: 2021-06-08 — End: 2021-11-21

## 2021-06-08 NOTE — Patient Instructions (Signed)

## 2021-06-08 NOTE — Progress Notes (Signed)
? ?06/08/2021 ?10:12 AM  ? ?Edward Taylor ?[birth year] ?[id number] ? ?Referring provider: Claretta Fraise, MD ?[address] [place],   [address] ? ?Followup prostate cancer ? ? ?HPI: ?Edward Taylor is a 66yo here for followup for prostate cancer. He is currently on ADT. He had his 3rd IMRT treatment this morning. He has mild hot flashes. No significant LUTS. No other complaints today.  ? ? ?PMH: ?Past Medical History:  ?Diagnosis Date  ? Allergy   ? Basal cell carcinoma   ? GERD (gastroesophageal reflux disease)   ? Hyperlipidemia   ? Hypertension   ? Prostate cancer ([place])   ? ? ?Surgical History: ?Past Surgical History:  ?Procedure Laterality Date  ? COLONOSCOPY    ? GOLD SEED IMPLANT N/A 05/24/2021  ? Procedure: GOLD SEED IMPLANT;  Surgeon: Edward Gustin, MD;  Location: [facility];  Service: Urology;  Laterality: N/A;  ? SPACE OAR INSTILLATION N/A 05/24/2021  ? Procedure: SPACE OAR INSTILLATION;  Surgeon: Edward Gustin, MD;  Location: [facility];  Service: Urology;  Laterality: N/A;  ? ? ?Home Medications:  ?Allergies as of 06/08/2021   ?No Known Allergies ?  ? ?  ?Medication List  ?  ? ?  ? Accurate as of Jun 08, 2021 10:12 AM. If you have any questions, ask your nurse or doctor.  ?  ?  ? ?  ? ?cetirizine 10 MG tablet ?Commonly known as: ZYRTEC ?Take 10 mg by mouth daily. ?  ?famotidine 40 MG tablet ?Commonly known as: PEPCID ?TAKE 1 TABLET DAILY ?  ?fexofenadine-pseudoephedrine 60-120 MG 12 hr tablet ?Commonly known as: ALLEGRA-D ?Take 1 tablet by mouth daily as needed (allergies/congestion). ?  ?fluticasone 50 MCG/ACT nasal spray ?Commonly known as: FLONASE ?Place 1 spray into both nostrils daily. ?What changed: when to take this ?  ?lisinopril 20 MG tablet ?Commonly known as: ZESTRIL ?TAKE 1 TABLET DAILY ?  ?megestrol 20 MG tablet ?Commonly known as: MEGACE ?Take 1 tablet (20 mg total) by mouth 2 (two) times daily as needed. ?  ?meloxicam 15 MG tablet ?Commonly known as: MOBIC ?Take 15 mg by mouth daily as  needed for pain. ?  ?metoprolol succinate 100 MG 24 hr tablet ?Commonly known as: TOPROL-XL ?Take 1 tablet (100 mg total) by mouth daily. Take with or immediately following a meal. ?  ?Orgovyx 120 MG Tabs ?Generic drug: Relugolix ?Take 1 capsule by mouth daily. ?  ?sildenafil 20 MG tablet ?Commonly known as: REVATIO ?TAKE 2-5 PILLS AT ONCE BY MOUTH WITH EACH SEXUAL ENCOUNTER ?  ?simvastatin 40 MG tablet ?Commonly known as: ZOCOR ?TAKE 1 TABLET DAILY AT     6:00PM ?  ?traMADol 50 MG tablet ?Commonly known as: Ultram ?Take 1 tablet (50 mg total) by mouth every 6 (six) hours as needed. ?  ?trimethoprim-polymyxin b ophthalmic solution ?Commonly known as: POLYTRIM ?Place 1 drop into both eyes daily as needed for irritation. ?  ?Turmeric 500 MG Caps ?Take 500 mg by mouth daily. ?  ?Vitamin D 125 MCG (5000 UT) Caps ?Take 5,000 Units by mouth daily. ?  ?zinc gluconate 50 MG tablet ?Take 50 mg by mouth daily. ?  ? ?  ? ? ?Allergies: No Known Allergies ? ?Family History: ?Family History  ?Problem Relation Age of Onset  ? Cancer Mother   ? Hypertension Mother   ? Arthritis Father   ? Diabetes Father   ? Hyperlipidemia Father   ? Hypertension Father   ? Stroke Father   ?  Heart disease Maternal Grandfather   ? Heart disease Paternal Grandfather   ? ? ?Social History:  reports that he has never smoked. He has never used smokeless tobacco. He reports current alcohol use of about 4.0 standard drinks per week. He reports that he does not use drugs. ? ?ROS: ?All other review of systems were reviewed and are negative except what is noted above in HPI ? ?Physical Exam: ?BP (!) 144/77   Pulse 86   ?Constitutional:  Alert and oriented, No acute distress. ?HEENT: Edward Taylor AT, moist mucus membranes.  Trachea midline, no masses. ?Cardiovascular: No clubbing, cyanosis, or edema. ?Respiratory: Normal respiratory effort, no increased work of breathing. ?GI: Abdomen is soft, nontender, nondistended, no abdominal masses ?GU: No CVA tenderness.   ?Lymph: No cervical or inguinal lymphadenopathy. ?Skin: No rashes, bruises or suspicious lesions. ?Neurologic: Grossly intact, no focal deficits, moving all 4 extremities. ?Psychiatric: Normal mood and affect. ? ?Laboratory Data: ?Lab Results  ?Component Value Date  ? WBC 8.3 11/20/2020  ? HGB 15.9 11/20/2020  ? HCT 46.8 11/20/2020  ? MCV 91 11/20/2020  ? PLT 213 11/20/2020  ? ? ?Lab Results  ?Component Value Date  ? CREATININE 0.80 03/30/2021  ? ? ?No results found for: PSA ? ?Lab Results  ?Component Value Date  ? TESTOSTERONE <3 (L) 05/01/2021  ? ? ?No results found for: HGBA1C ? ?Urinalysis ?   ?Component Value Date/Time  ? APPEARANCEUR Clear 05/11/2021 0919  ? GLUCOSEU Negative 05/11/2021 0919  ? BILIRUBINUR Negative 05/11/2021 0919  ? PROTEINUR Negative 05/11/2021 0919  ? NITRITE Negative 05/11/2021 0919  ? LEUKOCYTESUR Negative 05/11/2021 0919  ? ? ?Lab Results  ?Component Value Date  ? LABMICR Comment 05/11/2021  ? [place] None seen 04/04/2021  ? LABEPIT None seen 04/04/2021  ? MUCUS Present 04/04/2021  ? BACTERIA None seen 04/04/2021  ? ? ?Pertinent Imaging: ? ?No results found for this or any previous visit. ? ?No results found for this or any previous visit. ? ?No results found for this or any previous visit. ? ?No results found for this or any previous visit. ? ?No results found for this or any previous visit. ? ?No results found for this or any previous visit. ? ?No results found for this or any previous visit. ? ?No results found for this or any previous visit. ? ? ?Assessment & Plan:   ? ?1. Prostate cancer ([place]) ?-Continue orgovyx daily. RTC 3 monthsd with PSA. Megace prn for hot flashes ?- Urinalysis, Routine w reflex microscopic ? ? ?No follow-ups on file. ? ?Edward Bang, MD ? ?[facility] [place] ? ? ?

## 2021-06-11 ENCOUNTER — Other Ambulatory Visit: Payer: Self-pay

## 2021-06-11 ENCOUNTER — Ambulatory Visit
Admission: RE | Admit: 2021-06-11 | Discharge: 2021-06-11 | Disposition: A | Discharge status: Home / Self Care | Payer: BC Managed Care – PPO | Source: Ambulatory Visit | Attending: Radiation Oncology | Admitting: Radiation Oncology

## 2021-06-11 DIAGNOSIS — Z510 Encounter for antineoplastic radiation therapy: Secondary | ICD-10-CM | POA: Diagnosis not present

## 2021-06-11 DIAGNOSIS — C61 Malignant neoplasm of prostate: Secondary | ICD-10-CM | POA: Diagnosis not present

## 2021-06-11 DIAGNOSIS — Z191 Hormone sensitive malignancy status: Secondary | ICD-10-CM | POA: Diagnosis not present

## 2021-06-11 LAB — RAD ONC ARIA SESSION SUMMARY
Course Elapsed Days: 5
Plan Fractions Treated to Date: 4
Plan Prescribed Dose Per Fraction: 2.5 Gy
Plan Total Fractions Prescribed: 28
Plan Total Prescribed Dose: 70 Gy
Reference Point Dosage Given to Date: 10 Gy
Reference Point Session Dosage Given: 2.5 Gy
Session Number: 4

## 2021-06-12 ENCOUNTER — Other Ambulatory Visit: Payer: Self-pay

## 2021-06-12 ENCOUNTER — Ambulatory Visit
Admission: RE | Admit: 2021-06-12 | Discharge: 2021-06-12 | Disposition: A | Discharge status: Home / Self Care | Payer: BC Managed Care – PPO | Source: Ambulatory Visit | Attending: Radiation Oncology | Admitting: Radiation Oncology

## 2021-06-12 DIAGNOSIS — Z510 Encounter for antineoplastic radiation therapy: Secondary | ICD-10-CM | POA: Diagnosis not present

## 2021-06-12 DIAGNOSIS — Z191 Hormone sensitive malignancy status: Secondary | ICD-10-CM | POA: Diagnosis not present

## 2021-06-12 DIAGNOSIS — C61 Malignant neoplasm of prostate: Secondary | ICD-10-CM | POA: Diagnosis not present

## 2021-06-12 LAB — RAD ONC ARIA SESSION SUMMARY
Course Elapsed Days: 6
Plan Fractions Treated to Date: 5
Plan Prescribed Dose Per Fraction: 2.5 Gy
Plan Total Fractions Prescribed: 28
Plan Total Prescribed Dose: 70 Gy
Reference Point Dosage Given to Date: 12.5 Gy
Reference Point Session Dosage Given: 2.5 Gy
Session Number: 5

## 2021-06-13 ENCOUNTER — Other Ambulatory Visit: Payer: Self-pay

## 2021-06-13 ENCOUNTER — Ambulatory Visit: Payer: BC Managed Care – PPO

## 2021-06-13 ENCOUNTER — Ambulatory Visit
Admission: RE | Admit: 2021-06-13 | Discharge: 2021-06-13 | Disposition: A | Discharge status: Home / Self Care | Payer: BC Managed Care – PPO | Source: Ambulatory Visit | Attending: Radiation Oncology | Admitting: Radiation Oncology

## 2021-06-13 DIAGNOSIS — Z510 Encounter for antineoplastic radiation therapy: Secondary | ICD-10-CM | POA: Diagnosis not present

## 2021-06-13 DIAGNOSIS — C61 Malignant neoplasm of prostate: Secondary | ICD-10-CM | POA: Diagnosis not present

## 2021-06-13 DIAGNOSIS — Z191 Hormone sensitive malignancy status: Secondary | ICD-10-CM | POA: Diagnosis not present

## 2021-06-13 LAB — RAD ONC ARIA SESSION SUMMARY
Course Elapsed Days: 7
Plan Fractions Treated to Date: 6
Plan Prescribed Dose Per Fraction: 2.5 Gy
Plan Total Fractions Prescribed: 28
Plan Total Prescribed Dose: 70 Gy
Reference Point Dosage Given to Date: 15 Gy
Reference Point Session Dosage Given: 2.5 Gy
Session Number: 6

## 2021-06-14 ENCOUNTER — Ambulatory Visit
Admission: RE | Admit: 2021-06-14 | Discharge: 2021-06-14 | Disposition: A | Discharge status: Home / Self Care | Payer: BC Managed Care – PPO | Source: Ambulatory Visit | Attending: Radiation Oncology | Admitting: Radiation Oncology

## 2021-06-14 ENCOUNTER — Ambulatory Visit: Payer: BC Managed Care – PPO

## 2021-06-14 ENCOUNTER — Other Ambulatory Visit: Payer: Self-pay

## 2021-06-14 DIAGNOSIS — Z510 Encounter for antineoplastic radiation therapy: Secondary | ICD-10-CM | POA: Diagnosis not present

## 2021-06-14 DIAGNOSIS — Z191 Hormone sensitive malignancy status: Secondary | ICD-10-CM | POA: Diagnosis not present

## 2021-06-14 DIAGNOSIS — C61 Malignant neoplasm of prostate: Secondary | ICD-10-CM | POA: Diagnosis not present

## 2021-06-14 LAB — RAD ONC ARIA SESSION SUMMARY
Course Elapsed Days: 8
Plan Fractions Treated to Date: 7
Plan Prescribed Dose Per Fraction: 2.5 Gy
Plan Total Fractions Prescribed: 28
Plan Total Prescribed Dose: 70 Gy
Reference Point Dosage Given to Date: 17.5 Gy
Reference Point Session Dosage Given: 2.5 Gy
Session Number: 7

## 2021-06-15 ENCOUNTER — Ambulatory Visit: Payer: BC Managed Care – PPO

## 2021-06-15 ENCOUNTER — Ambulatory Visit
Admission: RE | Admit: 2021-06-15 | Discharge: 2021-06-15 | Disposition: A | Discharge status: Home / Self Care | Payer: BC Managed Care – PPO | Source: Ambulatory Visit | Attending: Radiation Oncology | Admitting: Radiation Oncology

## 2021-06-15 ENCOUNTER — Other Ambulatory Visit: Payer: Self-pay

## 2021-06-15 DIAGNOSIS — Z510 Encounter for antineoplastic radiation therapy: Secondary | ICD-10-CM | POA: Diagnosis not present

## 2021-06-15 DIAGNOSIS — C61 Malignant neoplasm of prostate: Secondary | ICD-10-CM | POA: Diagnosis not present

## 2021-06-15 DIAGNOSIS — Z191 Hormone sensitive malignancy status: Secondary | ICD-10-CM | POA: Diagnosis not present

## 2021-06-15 LAB — RAD ONC ARIA SESSION SUMMARY
Course Elapsed Days: 9
Plan Fractions Treated to Date: 8
Plan Prescribed Dose Per Fraction: 2.5 Gy
Plan Total Fractions Prescribed: 28
Plan Total Prescribed Dose: 70 Gy
Reference Point Dosage Given to Date: 20 Gy
Reference Point Session Dosage Given: 2.5 Gy
Session Number: 8

## 2021-06-18 ENCOUNTER — Other Ambulatory Visit: Payer: Self-pay

## 2021-06-18 ENCOUNTER — Ambulatory Visit
Admission: RE | Admit: 2021-06-18 | Discharge: 2021-06-18 | Disposition: A | Discharge status: Home / Self Care | Payer: BC Managed Care – PPO | Source: Ambulatory Visit | Attending: Radiation Oncology | Admitting: Radiation Oncology

## 2021-06-18 ENCOUNTER — Ambulatory Visit: Payer: BC Managed Care – PPO

## 2021-06-18 DIAGNOSIS — C61 Malignant neoplasm of prostate: Secondary | ICD-10-CM | POA: Diagnosis not present

## 2021-06-18 DIAGNOSIS — Z191 Hormone sensitive malignancy status: Secondary | ICD-10-CM | POA: Diagnosis not present

## 2021-06-18 DIAGNOSIS — Z510 Encounter for antineoplastic radiation therapy: Secondary | ICD-10-CM | POA: Diagnosis not present

## 2021-06-18 LAB — RAD ONC ARIA SESSION SUMMARY
Course Elapsed Days: 12
Plan Fractions Treated to Date: 9
Plan Prescribed Dose Per Fraction: 2.5 Gy
Plan Total Fractions Prescribed: 28
Plan Total Prescribed Dose: 70 Gy
Reference Point Dosage Given to Date: 22.5 Gy
Reference Point Session Dosage Given: 2.5 Gy
Session Number: 9

## 2021-06-19 ENCOUNTER — Other Ambulatory Visit: Payer: Self-pay

## 2021-06-19 ENCOUNTER — Ambulatory Visit
Admission: RE | Admit: 2021-06-19 | Discharge: 2021-06-19 | Disposition: A | Discharge status: Home / Self Care | Payer: BC Managed Care – PPO | Source: Ambulatory Visit | Attending: Radiation Oncology | Admitting: Radiation Oncology

## 2021-06-19 ENCOUNTER — Ambulatory Visit: Payer: BC Managed Care – PPO

## 2021-06-19 DIAGNOSIS — Z510 Encounter for antineoplastic radiation therapy: Secondary | ICD-10-CM | POA: Diagnosis not present

## 2021-06-19 DIAGNOSIS — Z191 Hormone sensitive malignancy status: Secondary | ICD-10-CM | POA: Diagnosis not present

## 2021-06-19 DIAGNOSIS — C61 Malignant neoplasm of prostate: Secondary | ICD-10-CM | POA: Diagnosis not present

## 2021-06-19 LAB — RAD ONC ARIA SESSION SUMMARY
Course Elapsed Days: 13
Plan Fractions Treated to Date: 10
Plan Prescribed Dose Per Fraction: 2.5 Gy
Plan Total Fractions Prescribed: 28
Plan Total Prescribed Dose: 70 Gy
Reference Point Dosage Given to Date: 25 Gy
Reference Point Session Dosage Given: 2.5 Gy
Session Number: 10

## 2021-06-20 ENCOUNTER — Ambulatory Visit: Payer: BC Managed Care – PPO

## 2021-06-20 ENCOUNTER — Other Ambulatory Visit: Payer: Self-pay

## 2021-06-20 ENCOUNTER — Ambulatory Visit
Admission: RE | Admit: 2021-06-20 | Discharge: 2021-06-20 | Disposition: A | Discharge status: Home / Self Care | Payer: BC Managed Care – PPO | Source: Ambulatory Visit | Attending: Radiation Oncology | Admitting: Radiation Oncology

## 2021-06-20 DIAGNOSIS — Z191 Hormone sensitive malignancy status: Secondary | ICD-10-CM | POA: Diagnosis not present

## 2021-06-20 DIAGNOSIS — Z510 Encounter for antineoplastic radiation therapy: Secondary | ICD-10-CM | POA: Diagnosis not present

## 2021-06-20 DIAGNOSIS — C61 Malignant neoplasm of prostate: Secondary | ICD-10-CM | POA: Diagnosis not present

## 2021-06-20 LAB — RAD ONC ARIA SESSION SUMMARY
Course Elapsed Days: 14
Plan Fractions Treated to Date: 11
Plan Prescribed Dose Per Fraction: 2.5 Gy
Plan Total Fractions Prescribed: 28
Plan Total Prescribed Dose: 70 Gy
Reference Point Dosage Given to Date: 27.5 Gy
Reference Point Session Dosage Given: 2.5 Gy
Session Number: 11

## 2021-06-21 ENCOUNTER — Ambulatory Visit: Payer: BC Managed Care – PPO

## 2021-06-21 ENCOUNTER — Ambulatory Visit
Admission: RE | Admit: 2021-06-21 | Discharge: 2021-06-21 | Disposition: A | Discharge status: Home / Self Care | Payer: BC Managed Care – PPO | Source: Ambulatory Visit | Attending: Radiation Oncology | Admitting: Radiation Oncology

## 2021-06-21 ENCOUNTER — Other Ambulatory Visit: Payer: Self-pay

## 2021-06-21 DIAGNOSIS — Z191 Hormone sensitive malignancy status: Secondary | ICD-10-CM | POA: Diagnosis not present

## 2021-06-21 DIAGNOSIS — Z510 Encounter for antineoplastic radiation therapy: Secondary | ICD-10-CM | POA: Diagnosis not present

## 2021-06-21 DIAGNOSIS — C61 Malignant neoplasm of prostate: Secondary | ICD-10-CM | POA: Diagnosis not present

## 2021-06-21 LAB — RAD ONC ARIA SESSION SUMMARY
Course Elapsed Days: 15
Plan Fractions Treated to Date: 12
Plan Prescribed Dose Per Fraction: 2.5 Gy
Plan Total Fractions Prescribed: 28
Plan Total Prescribed Dose: 70 Gy
Reference Point Dosage Given to Date: 30 Gy
Reference Point Session Dosage Given: 2.5 Gy
Session Number: 12

## 2021-06-22 ENCOUNTER — Ambulatory Visit
Admission: RE | Admit: 2021-06-22 | Discharge: 2021-06-22 | Disposition: A | Discharge status: Home / Self Care | Payer: BC Managed Care – PPO | Source: Ambulatory Visit | Attending: Radiation Oncology | Admitting: Radiation Oncology

## 2021-06-22 ENCOUNTER — Ambulatory Visit: Payer: BC Managed Care – PPO

## 2021-06-22 ENCOUNTER — Other Ambulatory Visit: Payer: Self-pay

## 2021-06-22 DIAGNOSIS — C61 Malignant neoplasm of prostate: Secondary | ICD-10-CM | POA: Diagnosis not present

## 2021-06-22 DIAGNOSIS — Z191 Hormone sensitive malignancy status: Secondary | ICD-10-CM | POA: Diagnosis not present

## 2021-06-22 DIAGNOSIS — Z510 Encounter for antineoplastic radiation therapy: Secondary | ICD-10-CM | POA: Diagnosis not present

## 2021-06-22 LAB — RAD ONC ARIA SESSION SUMMARY
Course Elapsed Days: 16
Plan Fractions Treated to Date: 13
Plan Prescribed Dose Per Fraction: 2.5 Gy
Plan Total Fractions Prescribed: 28
Plan Total Prescribed Dose: 70 Gy
Reference Point Dosage Given to Date: 32.5 Gy
Reference Point Session Dosage Given: 2.5 Gy
Session Number: 13

## 2021-06-25 ENCOUNTER — Other Ambulatory Visit: Payer: Self-pay

## 2021-06-25 ENCOUNTER — Ambulatory Visit: Payer: BC Managed Care – PPO

## 2021-06-25 ENCOUNTER — Ambulatory Visit
Admission: RE | Admit: 2021-06-25 | Discharge: 2021-06-25 | Disposition: A | Discharge status: Home / Self Care | Payer: BC Managed Care – PPO | Source: Ambulatory Visit | Attending: Radiation Oncology | Admitting: Radiation Oncology

## 2021-06-25 DIAGNOSIS — Z191 Hormone sensitive malignancy status: Secondary | ICD-10-CM | POA: Diagnosis not present

## 2021-06-25 DIAGNOSIS — Z510 Encounter for antineoplastic radiation therapy: Secondary | ICD-10-CM | POA: Diagnosis not present

## 2021-06-25 DIAGNOSIS — C61 Malignant neoplasm of prostate: Secondary | ICD-10-CM | POA: Diagnosis not present

## 2021-06-25 LAB — RAD ONC ARIA SESSION SUMMARY
Course Elapsed Days: 19
Plan Fractions Treated to Date: 14
Plan Prescribed Dose Per Fraction: 2.5 Gy
Plan Total Fractions Prescribed: 28
Plan Total Prescribed Dose: 70 Gy
Reference Point Dosage Given to Date: 35 Gy
Reference Point Session Dosage Given: 2.5 Gy
Session Number: 14

## 2021-06-26 ENCOUNTER — Ambulatory Visit: Payer: BC Managed Care – PPO

## 2021-06-26 ENCOUNTER — Other Ambulatory Visit: Payer: Self-pay

## 2021-06-26 ENCOUNTER — Ambulatory Visit
Admission: RE | Admit: 2021-06-26 | Discharge: 2021-06-26 | Disposition: A | Discharge status: Home / Self Care | Payer: BC Managed Care – PPO | Source: Ambulatory Visit | Attending: Radiation Oncology | Admitting: Radiation Oncology

## 2021-06-26 DIAGNOSIS — Z510 Encounter for antineoplastic radiation therapy: Secondary | ICD-10-CM | POA: Diagnosis not present

## 2021-06-26 DIAGNOSIS — C61 Malignant neoplasm of prostate: Secondary | ICD-10-CM | POA: Diagnosis not present

## 2021-06-26 DIAGNOSIS — Z191 Hormone sensitive malignancy status: Secondary | ICD-10-CM | POA: Diagnosis not present

## 2021-06-26 LAB — RAD ONC ARIA SESSION SUMMARY
Course Elapsed Days: 20
Plan Fractions Treated to Date: 15
Plan Prescribed Dose Per Fraction: 2.5 Gy
Plan Total Fractions Prescribed: 28
Plan Total Prescribed Dose: 70 Gy
Reference Point Dosage Given to Date: 37.5 Gy
Reference Point Session Dosage Given: 2.5 Gy
Session Number: 15

## 2021-06-27 ENCOUNTER — Other Ambulatory Visit: Payer: Self-pay

## 2021-06-27 ENCOUNTER — Ambulatory Visit
Admission: RE | Admit: 2021-06-27 | Discharge: 2021-06-27 | Disposition: A | Discharge status: Home / Self Care | Payer: BC Managed Care – PPO | Source: Ambulatory Visit | Attending: Radiation Oncology | Admitting: Radiation Oncology

## 2021-06-27 ENCOUNTER — Ambulatory Visit: Payer: BC Managed Care – PPO

## 2021-06-27 DIAGNOSIS — Z510 Encounter for antineoplastic radiation therapy: Secondary | ICD-10-CM | POA: Diagnosis not present

## 2021-06-27 DIAGNOSIS — C61 Malignant neoplasm of prostate: Secondary | ICD-10-CM | POA: Diagnosis not present

## 2021-06-27 DIAGNOSIS — Z191 Hormone sensitive malignancy status: Secondary | ICD-10-CM | POA: Diagnosis not present

## 2021-06-27 LAB — RAD ONC ARIA SESSION SUMMARY
Course Elapsed Days: 21
Plan Fractions Treated to Date: 16
Plan Prescribed Dose Per Fraction: 2.5 Gy
Plan Total Fractions Prescribed: 28
Plan Total Prescribed Dose: 70 Gy
Reference Point Dosage Given to Date: 40 Gy
Reference Point Session Dosage Given: 2.5 Gy
Session Number: 16

## 2021-06-28 ENCOUNTER — Ambulatory Visit: Payer: BC Managed Care – PPO

## 2021-06-28 ENCOUNTER — Ambulatory Visit
Admission: RE | Admit: 2021-06-28 | Discharge: 2021-06-28 | Disposition: A | Discharge status: Home / Self Care | Payer: BC Managed Care – PPO | Source: Ambulatory Visit | Attending: Radiation Oncology | Admitting: Radiation Oncology

## 2021-06-28 ENCOUNTER — Other Ambulatory Visit: Payer: Self-pay

## 2021-06-28 DIAGNOSIS — Z191 Hormone sensitive malignancy status: Secondary | ICD-10-CM | POA: Diagnosis not present

## 2021-06-28 DIAGNOSIS — C61 Malignant neoplasm of prostate: Secondary | ICD-10-CM | POA: Diagnosis not present

## 2021-06-28 DIAGNOSIS — Z510 Encounter for antineoplastic radiation therapy: Secondary | ICD-10-CM | POA: Diagnosis not present

## 2021-06-28 LAB — RAD ONC ARIA SESSION SUMMARY
Course Elapsed Days: 22
Plan Fractions Treated to Date: 17
Plan Prescribed Dose Per Fraction: 2.5 Gy
Plan Total Fractions Prescribed: 28
Plan Total Prescribed Dose: 70 Gy
Reference Point Dosage Given to Date: 42.5 Gy
Reference Point Session Dosage Given: 2.5 Gy
Session Number: 17

## 2021-06-29 ENCOUNTER — Ambulatory Visit: Payer: BC Managed Care – PPO

## 2021-06-29 ENCOUNTER — Other Ambulatory Visit: Payer: Self-pay

## 2021-06-29 ENCOUNTER — Ambulatory Visit
Admission: RE | Admit: 2021-06-29 | Discharge: 2021-06-29 | Disposition: A | Discharge status: Home / Self Care | Payer: BC Managed Care – PPO | Source: Ambulatory Visit | Attending: Radiation Oncology | Admitting: Radiation Oncology

## 2021-06-29 DIAGNOSIS — Z510 Encounter for antineoplastic radiation therapy: Secondary | ICD-10-CM | POA: Diagnosis not present

## 2021-06-29 DIAGNOSIS — C61 Malignant neoplasm of prostate: Secondary | ICD-10-CM | POA: Diagnosis not present

## 2021-06-29 DIAGNOSIS — Z191 Hormone sensitive malignancy status: Secondary | ICD-10-CM | POA: Diagnosis not present

## 2021-06-29 LAB — RAD ONC ARIA SESSION SUMMARY
Course Elapsed Days: 23
Plan Fractions Treated to Date: 18
Plan Prescribed Dose Per Fraction: 2.5 Gy
Plan Total Fractions Prescribed: 28
Plan Total Prescribed Dose: 70 Gy
Reference Point Dosage Given to Date: 45 Gy
Reference Point Session Dosage Given: 2.5 Gy
Session Number: 18

## 2021-07-03 ENCOUNTER — Ambulatory Visit
Admission: RE | Admit: 2021-07-03 | Discharge: 2021-07-03 | Disposition: A | Discharge status: Home / Self Care | Payer: BC Managed Care – PPO | Source: Ambulatory Visit | Attending: Radiation Oncology | Admitting: Radiation Oncology

## 2021-07-03 ENCOUNTER — Ambulatory Visit: Payer: BC Managed Care – PPO

## 2021-07-03 ENCOUNTER — Other Ambulatory Visit: Payer: Self-pay

## 2021-07-03 DIAGNOSIS — Z191 Hormone sensitive malignancy status: Secondary | ICD-10-CM | POA: Diagnosis not present

## 2021-07-03 DIAGNOSIS — Z510 Encounter for antineoplastic radiation therapy: Secondary | ICD-10-CM | POA: Diagnosis not present

## 2021-07-03 DIAGNOSIS — C61 Malignant neoplasm of prostate: Secondary | ICD-10-CM | POA: Diagnosis not present

## 2021-07-03 LAB — RAD ONC ARIA SESSION SUMMARY
Course Elapsed Days: 27
Plan Fractions Treated to Date: 19
Plan Prescribed Dose Per Fraction: 2.5 Gy
Plan Total Fractions Prescribed: 28
Plan Total Prescribed Dose: 70 Gy
Reference Point Dosage Given to Date: 47.5 Gy
Reference Point Session Dosage Given: 2.5 Gy
Session Number: 19

## 2021-07-04 ENCOUNTER — Other Ambulatory Visit: Payer: Self-pay

## 2021-07-04 ENCOUNTER — Ambulatory Visit: Payer: BC Managed Care – PPO

## 2021-07-04 ENCOUNTER — Encounter: Payer: Self-pay | Admitting: Family Medicine

## 2021-07-04 ENCOUNTER — Ambulatory Visit
Admission: RE | Admit: 2021-07-04 | Discharge: 2021-07-04 | Disposition: A | Discharge status: Home / Self Care | Payer: BC Managed Care – PPO | Source: Ambulatory Visit | Attending: Radiation Oncology | Admitting: Radiation Oncology

## 2021-07-04 DIAGNOSIS — C61 Malignant neoplasm of prostate: Secondary | ICD-10-CM | POA: Diagnosis not present

## 2021-07-04 DIAGNOSIS — Z510 Encounter for antineoplastic radiation therapy: Secondary | ICD-10-CM | POA: Diagnosis not present

## 2021-07-04 DIAGNOSIS — Z191 Hormone sensitive malignancy status: Secondary | ICD-10-CM | POA: Diagnosis not present

## 2021-07-04 DIAGNOSIS — I10 Essential (primary) hypertension: Secondary | ICD-10-CM

## 2021-07-04 LAB — RAD ONC ARIA SESSION SUMMARY
Course Elapsed Days: 28
Plan Fractions Treated to Date: 20
Plan Prescribed Dose Per Fraction: 2.5 Gy
Plan Total Fractions Prescribed: 28
Plan Total Prescribed Dose: 70 Gy
Reference Point Dosage Given to Date: 50 Gy
Reference Point Session Dosage Given: 2.5 Gy
Session Number: 20

## 2021-07-04 MED ORDER — METOPROLOL SUCCINATE ER 100 MG PO TB24: 100.0000 mg | ORAL_TABLET | Freq: Every day | ORAL | 0 refills | Status: DC

## 2021-07-05 ENCOUNTER — Other Ambulatory Visit: Payer: Self-pay

## 2021-07-05 ENCOUNTER — Ambulatory Visit: Payer: BC Managed Care – PPO

## 2021-07-05 ENCOUNTER — Ambulatory Visit
Admission: RE | Admit: 2021-07-05 | Discharge: 2021-07-05 | Disposition: A | Discharge status: Home / Self Care | Payer: BC Managed Care – PPO | Source: Ambulatory Visit | Attending: Radiation Oncology | Admitting: Radiation Oncology

## 2021-07-05 DIAGNOSIS — C61 Malignant neoplasm of prostate: Secondary | ICD-10-CM | POA: Insufficient documentation

## 2021-07-05 DIAGNOSIS — Z510 Encounter for antineoplastic radiation therapy: Secondary | ICD-10-CM | POA: Diagnosis not present

## 2021-07-05 DIAGNOSIS — Z191 Hormone sensitive malignancy status: Secondary | ICD-10-CM | POA: Diagnosis not present

## 2021-07-05 LAB — RAD ONC ARIA SESSION SUMMARY
Course Elapsed Days: 29
Plan Fractions Treated to Date: 21
Plan Prescribed Dose Per Fraction: 2.5 Gy
Plan Total Fractions Prescribed: 28
Plan Total Prescribed Dose: 70 Gy
Reference Point Dosage Given to Date: 52.5 Gy
Reference Point Session Dosage Given: 2.5 Gy
Session Number: 21

## 2021-07-06 ENCOUNTER — Ambulatory Visit
Admission: RE | Admit: 2021-07-06 | Discharge: 2021-07-06 | Disposition: A | Discharge status: Home / Self Care | Payer: BC Managed Care – PPO | Source: Ambulatory Visit | Attending: Radiation Oncology | Admitting: Radiation Oncology

## 2021-07-06 ENCOUNTER — Ambulatory Visit: Payer: BC Managed Care – PPO

## 2021-07-06 ENCOUNTER — Other Ambulatory Visit: Payer: Self-pay

## 2021-07-06 DIAGNOSIS — Z510 Encounter for antineoplastic radiation therapy: Secondary | ICD-10-CM | POA: Diagnosis not present

## 2021-07-06 DIAGNOSIS — C61 Malignant neoplasm of prostate: Secondary | ICD-10-CM | POA: Diagnosis not present

## 2021-07-06 DIAGNOSIS — Z191 Hormone sensitive malignancy status: Secondary | ICD-10-CM | POA: Diagnosis not present

## 2021-07-06 LAB — RAD ONC ARIA SESSION SUMMARY
Course Elapsed Days: 30
Plan Fractions Treated to Date: 22
Plan Prescribed Dose Per Fraction: 2.5 Gy
Plan Total Fractions Prescribed: 28
Plan Total Prescribed Dose: 70 Gy
Reference Point Dosage Given to Date: 55 Gy
Reference Point Session Dosage Given: 2.5 Gy
Session Number: 22

## 2021-07-09 ENCOUNTER — Ambulatory Visit: Payer: BC Managed Care – PPO

## 2021-07-09 ENCOUNTER — Other Ambulatory Visit: Payer: Self-pay

## 2021-07-09 ENCOUNTER — Ambulatory Visit
Admission: RE | Admit: 2021-07-09 | Discharge: 2021-07-09 | Disposition: A | Discharge status: Home / Self Care | Payer: BC Managed Care – PPO | Source: Ambulatory Visit | Attending: Radiation Oncology | Admitting: Radiation Oncology

## 2021-07-09 DIAGNOSIS — Z191 Hormone sensitive malignancy status: Secondary | ICD-10-CM | POA: Diagnosis not present

## 2021-07-09 DIAGNOSIS — Z510 Encounter for antineoplastic radiation therapy: Secondary | ICD-10-CM | POA: Diagnosis not present

## 2021-07-09 DIAGNOSIS — C61 Malignant neoplasm of prostate: Secondary | ICD-10-CM | POA: Diagnosis not present

## 2021-07-09 LAB — RAD ONC ARIA SESSION SUMMARY
Course Elapsed Days: 33
Plan Fractions Treated to Date: 23
Plan Prescribed Dose Per Fraction: 2.5 Gy
Plan Total Fractions Prescribed: 28
Plan Total Prescribed Dose: 70 Gy
Reference Point Dosage Given to Date: 57.5 Gy
Reference Point Session Dosage Given: 2.5 Gy
Session Number: 23

## 2021-07-10 ENCOUNTER — Ambulatory Visit
Admission: RE | Admit: 2021-07-10 | Discharge: 2021-07-10 | Disposition: A | Discharge status: Home / Self Care | Payer: BC Managed Care – PPO | Source: Ambulatory Visit | Attending: Radiation Oncology | Admitting: Radiation Oncology

## 2021-07-10 ENCOUNTER — Other Ambulatory Visit: Payer: Self-pay

## 2021-07-10 ENCOUNTER — Ambulatory Visit: Payer: BC Managed Care – PPO

## 2021-07-10 DIAGNOSIS — Z510 Encounter for antineoplastic radiation therapy: Secondary | ICD-10-CM | POA: Diagnosis not present

## 2021-07-10 DIAGNOSIS — Z191 Hormone sensitive malignancy status: Secondary | ICD-10-CM | POA: Diagnosis not present

## 2021-07-10 DIAGNOSIS — C61 Malignant neoplasm of prostate: Secondary | ICD-10-CM | POA: Diagnosis not present

## 2021-07-10 LAB — RAD ONC ARIA SESSION SUMMARY
Course Elapsed Days: 34
Plan Fractions Treated to Date: 24
Plan Prescribed Dose Per Fraction: 2.5 Gy
Plan Total Fractions Prescribed: 28
Plan Total Prescribed Dose: 70 Gy
Reference Point Dosage Given to Date: 60 Gy
Reference Point Session Dosage Given: 2.5 Gy
Session Number: 24

## 2021-07-11 ENCOUNTER — Other Ambulatory Visit: Payer: Self-pay

## 2021-07-11 ENCOUNTER — Ambulatory Visit
Admission: RE | Admit: 2021-07-11 | Discharge: 2021-07-11 | Disposition: A | Discharge status: Home / Self Care | Payer: BC Managed Care – PPO | Source: Ambulatory Visit | Attending: Radiation Oncology | Admitting: Radiation Oncology

## 2021-07-11 ENCOUNTER — Ambulatory Visit: Payer: BC Managed Care – PPO

## 2021-07-11 DIAGNOSIS — Z510 Encounter for antineoplastic radiation therapy: Secondary | ICD-10-CM | POA: Diagnosis not present

## 2021-07-11 DIAGNOSIS — Z191 Hormone sensitive malignancy status: Secondary | ICD-10-CM | POA: Diagnosis not present

## 2021-07-11 DIAGNOSIS — C61 Malignant neoplasm of prostate: Secondary | ICD-10-CM | POA: Diagnosis not present

## 2021-07-11 LAB — RAD ONC ARIA SESSION SUMMARY
Course Elapsed Days: 35
Plan Fractions Treated to Date: 25
Plan Prescribed Dose Per Fraction: 2.5 Gy
Plan Total Fractions Prescribed: 28
Plan Total Prescribed Dose: 70 Gy
Reference Point Dosage Given to Date: 62.5 Gy
Reference Point Session Dosage Given: 2.5 Gy
Session Number: 25

## 2021-07-12 ENCOUNTER — Ambulatory Visit: Payer: BC Managed Care – PPO

## 2021-07-12 ENCOUNTER — Ambulatory Visit
Admission: RE | Admit: 2021-07-12 | Discharge: 2021-07-12 | Disposition: A | Discharge status: Home / Self Care | Payer: BC Managed Care – PPO | Source: Ambulatory Visit | Attending: Radiation Oncology | Admitting: Radiation Oncology

## 2021-07-12 ENCOUNTER — Other Ambulatory Visit: Payer: Self-pay

## 2021-07-12 DIAGNOSIS — Z191 Hormone sensitive malignancy status: Secondary | ICD-10-CM | POA: Diagnosis not present

## 2021-07-12 DIAGNOSIS — C61 Malignant neoplasm of prostate: Secondary | ICD-10-CM | POA: Diagnosis not present

## 2021-07-12 DIAGNOSIS — Z510 Encounter for antineoplastic radiation therapy: Secondary | ICD-10-CM | POA: Diagnosis not present

## 2021-07-12 LAB — RAD ONC ARIA SESSION SUMMARY
Course Elapsed Days: 36
Plan Fractions Treated to Date: 26
Plan Prescribed Dose Per Fraction: 2.5 Gy
Plan Total Fractions Prescribed: 28
Plan Total Prescribed Dose: 70 Gy
Reference Point Dosage Given to Date: 65 Gy
Reference Point Session Dosage Given: 2.5 Gy
Session Number: 26

## 2021-07-13 ENCOUNTER — Other Ambulatory Visit: Payer: Self-pay

## 2021-07-13 ENCOUNTER — Ambulatory Visit
Admission: RE | Admit: 2021-07-13 | Discharge: 2021-07-13 | Disposition: A | Discharge status: Home / Self Care | Payer: BC Managed Care – PPO | Source: Ambulatory Visit | Attending: Radiation Oncology | Admitting: Radiation Oncology

## 2021-07-13 ENCOUNTER — Ambulatory Visit: Payer: BC Managed Care – PPO

## 2021-07-13 DIAGNOSIS — Z510 Encounter for antineoplastic radiation therapy: Secondary | ICD-10-CM | POA: Diagnosis not present

## 2021-07-13 DIAGNOSIS — C61 Malignant neoplasm of prostate: Secondary | ICD-10-CM | POA: Diagnosis not present

## 2021-07-13 DIAGNOSIS — Z191 Hormone sensitive malignancy status: Secondary | ICD-10-CM | POA: Diagnosis not present

## 2021-07-13 LAB — RAD ONC ARIA SESSION SUMMARY
Course Elapsed Days: 37
Plan Fractions Treated to Date: 27
Plan Prescribed Dose Per Fraction: 2.5 Gy
Plan Total Fractions Prescribed: 28
Plan Total Prescribed Dose: 70 Gy
Reference Point Dosage Given to Date: 67.5 Gy
Reference Point Session Dosage Given: 2.5 Gy
Session Number: 27

## 2021-07-16 ENCOUNTER — Other Ambulatory Visit: Payer: Self-pay

## 2021-07-16 ENCOUNTER — Ambulatory Visit
Admission: RE | Admit: 2021-07-16 | Discharge: 2021-07-16 | Disposition: A | Discharge status: Home / Self Care | Payer: BC Managed Care – PPO | Source: Ambulatory Visit | Attending: Radiation Oncology | Admitting: Radiation Oncology

## 2021-07-16 ENCOUNTER — Ambulatory Visit: Payer: BC Managed Care – PPO

## 2021-07-16 ENCOUNTER — Encounter: Payer: Self-pay | Admitting: Urology

## 2021-07-16 DIAGNOSIS — Z510 Encounter for antineoplastic radiation therapy: Secondary | ICD-10-CM | POA: Diagnosis not present

## 2021-07-16 DIAGNOSIS — Z191 Hormone sensitive malignancy status: Secondary | ICD-10-CM | POA: Diagnosis not present

## 2021-07-16 DIAGNOSIS — C61 Malignant neoplasm of prostate: Secondary | ICD-10-CM | POA: Diagnosis not present

## 2021-07-16 LAB — RAD ONC ARIA SESSION SUMMARY
Course Elapsed Days: 40
Plan Fractions Treated to Date: 28
Plan Prescribed Dose Per Fraction: 2.5 Gy
Plan Total Fractions Prescribed: 28
Plan Total Prescribed Dose: 70 Gy
Reference Point Dosage Given to Date: 70 Gy
Reference Point Session Dosage Given: 2.5 Gy
Session Number: 28

## 2021-07-17 ENCOUNTER — Ambulatory Visit: Payer: BC Managed Care – PPO

## 2021-07-18 ENCOUNTER — Ambulatory Visit: Payer: BC Managed Care – PPO

## 2021-07-19 ENCOUNTER — Ambulatory Visit: Payer: BC Managed Care – PPO

## 2021-07-20 ENCOUNTER — Ambulatory Visit: Payer: BC Managed Care – PPO

## 2021-07-23 ENCOUNTER — Ambulatory Visit: Payer: BC Managed Care – PPO

## 2021-08-22 ENCOUNTER — Encounter: Payer: Self-pay | Admitting: Urology

## 2021-08-22 NOTE — Progress Notes (Signed)
Telephone appointment. I verified patient's identity and began nursing interview. Patient reports some mild urinary urgency. No other issues reported at this time.  Meaningful use complete. I-PSS score of 4-mild. No urinary management medications. Urology appt- Aug, 2023  Reminded patient of his 8:30am-08/23/21 telephone appointment w/ Ashlyn Bruning PA-C. I left my extension [phone] in case patient needs anything. Patient verbalized understanding.  Patient contact [phone]

## 2021-08-23 ENCOUNTER — Ambulatory Visit
Admission: RE | Admit: 2021-08-23 | Discharge: 2021-08-23 | Disposition: A | Discharge status: Home / Self Care | Payer: BC Managed Care – PPO | Source: Ambulatory Visit | Attending: Urology | Admitting: Urology

## 2021-08-23 DIAGNOSIS — C61 Malignant neoplasm of prostate: Secondary | ICD-10-CM

## 2021-08-28 ENCOUNTER — Other Ambulatory Visit: Payer: Self-pay | Admitting: Urology

## 2021-08-28 DIAGNOSIS — C61 Malignant neoplasm of prostate: Secondary | ICD-10-CM

## 2021-08-28 NOTE — Progress Notes (Signed)
Radiation Oncology         [phone]) [phone] ________________________________  Name: Edward Taylor MRN: [id number]  Date: 08/23/2021  DOB: 1955-09-09  Post Treatment Note  CC: Claretta Fraise, MD  Cleon Gustin, MD  Diagnosis:   66 y.o. 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  Interval Since Last Radiation:  5.5 weeks  06/06/21 - 07/16/21; concurrent with ST-ADT:  The prostate was treated to 70 Gy in 28 fractions of 2.5 Gy  Narrative:  I spoke with the patient to conduct his routine scheduled 1 month follow up visit via telephone to spare the patient unnecessary potential exposure in the healthcare setting during the current COVID-19 pandemic.  The patient was notified in advance and gave permission to proceed with this visit format.  He tolerated radiation treatment relatively well with only minor urinary irritation and modest fatigue.  He did report increased frequency and urgency but denied any bowel issues.                              On review of systems, the patient states that he is doing very well in general.  His LUTS have pretty much resolved completely and he feels that he is back to his baseline at this point.  He has continued with periodic hot flashes and decreased stamina but this is significantly improved with the Megace.  He specifically denies dysuria, gross hematuria, straining to void, incomplete bladder emptying or incontinence.  He reports a healthy appetite and is maintaining his weight.  He denies abdominal pain, nausea, vomiting, diarrhea or constipation.  Overall, he is quite pleased with his progress to date.  ALLERGIES:  has No Known Allergies.  Meds: Current Outpatient Medications  Medication Sig Dispense Refill   cetirizine (ZYRTEC) 10 MG tablet Take 10 mg by mouth daily.     Cholecalciferol (VITAMIN D) 125 MCG (5000 UT) CAPS Take 5,000 Units by mouth daily.     famotidine (PEPCID) 40 MG tablet TAKE  1 TABLET DAILY 90 tablet 3   fexofenadine-pseudoephedrine (ALLEGRA-D) 60-120 MG 12 hr tablet Take 1 tablet by mouth daily as needed (allergies/congestion).     fluticasone (FLONASE) 50 MCG/ACT nasal spray Place 1 spray into both nostrils daily. (Patient taking differently: Place 1 spray into both nostrils at bedtime.) 150 g 1   lisinopril (ZESTRIL) 20 MG tablet TAKE 1 TABLET DAILY 90 tablet 3   megestrol (MEGACE) 20 MG tablet Take 1 tablet (20 mg total) by mouth 2 (two) times daily as needed. 60 tablet 3   meloxicam (MOBIC) 15 MG tablet Take 15 mg by mouth daily as needed for pain.     metoprolol succinate (TOPROL-XL) 100 MG 24 hr tablet Take 1 tablet (100 mg total) by mouth daily. Take with or immediately following a meal. 90 tablet 0   Relugolix (ORGOVYX) 120 MG TABS Take 1 capsule by mouth daily. 30 tablet 4   sildenafil (REVATIO) 20 MG tablet TAKE 2-5 PILLS AT ONCE BY MOUTH WITH EACH SEXUAL ENCOUNTER 100 tablet 3   simvastatin (ZOCOR) 40 MG tablet TAKE 1 TABLET DAILY AT     6:00PM 90 tablet 3   traMADol (ULTRAM) 50 MG tablet Take 1 tablet (50 mg total) by mouth every 6 (six) hours as needed. 15 tablet 0   trimethoprim-polymyxin b (POLYTRIM) ophthalmic solution Place 1 drop into both eyes daily as needed for  irritation.     Turmeric 500 MG CAPS Take 500 mg by mouth daily.     zinc gluconate 50 MG tablet Take 50 mg by mouth daily.     No current facility-administered medications for this encounter.    Physical Findings:  vitals were not taken for this visit.  Pain Assessment Pain Score: 0-No pain/10 Unable to assess due to telephone follow-up visit format.   Lab Findings: Lab Results  Component Value Date   WBC 8.3 11/20/2020   HGB 15.9 11/20/2020   HCT 46.8 11/20/2020   MCV 91 11/20/2020   PLT 213 11/20/2020     Radiographic Findings: No results found.  Impression/Plan: 1. 66 y.o. gentleman with Stage T1c adenocarcinoma of the prostate with Gleason score of 4+3, and PSA of  9.8. He had a recent follow-up visit with Dr. Alyson Ingles on 06/08/2021 and will continue to follow up with urology for ongoing PSA determinations.his next scheduled follow-up visit with Dr. Alyson Ingles is on 09/07/2021 with labs to be drawn prior. He understands what to expect with regards to PSA monitoring going forward. I will look forward to following his response to treatment via correspondence with urology, and would be happy to continue to participate in his care if clinically indicated. I talked to the patient about what to expect in the future, including his risk for erectile dysfunction and rectal bleeding. I encouraged him to call or return to the office if he has any questions regarding his previous radiation or possible radiation side effects. He was comfortable with this plan and will follow up as needed.     Nicholos Johns, PA-C

## 2021-08-28 NOTE — Progress Notes (Signed)
  Radiation Oncology         [phone]) [phone] ________________________________  Name: Edward Taylor MRN: [id number]  Date: 07/16/2021  DOB: [birth year]  End of Treatment Note  Diagnosis:   66 y.o. 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     Indication for treatment:  Curative, Definitive Radiotherapy       Radiation treatment dates:   06/06/21 - 07/16/21; concurrent with ST-ADT  Site/dose:   The prostate was treated to 70 Gy in 28 fractions of 2.5 Gy  Beams/energy:   The patient was treated with IMRT using volumetric arc therapy delivering 6 MV X-rays to clockwise and counterclockwise circumferential arcs with a 90 degree collimator offset to avoid dose scalloping.  Image guidance was performed with daily cone beam CT prior to each fraction to align to gold markers in the prostate and assure proper bladder and rectal fill volumes.  Immobilization was achieved with BodyFix custom mold.  Narrative: The patient tolerated radiation treatment relatively well with only minor urinary irritation and modest fatigue.  He did report increased frequency and urgency but denied any bowel issues.  Plan: The patient has completed radiation treatment. He will return to radiation oncology clinic for routine followup in one month. I advised him to call or return sooner if he has any questions or concerns related to his recovery or treatment. ________________________________  Sheral Apley. Tammi Klippel, M.D.

## 2021-08-29 ENCOUNTER — Other Ambulatory Visit: Payer: BC Managed Care – PPO

## 2021-08-29 DIAGNOSIS — C61 Malignant neoplasm of prostate: Secondary | ICD-10-CM | POA: Diagnosis not present

## 2021-08-30 LAB — PSA: Prostate Specific Ag, Serum: 0.2 ng/mL (ref 0.0–4.0)

## 2021-09-07 ENCOUNTER — Encounter: Payer: Self-pay | Admitting: Urology

## 2021-09-07 ENCOUNTER — Ambulatory Visit (INDEPENDENT_AMBULATORY_CARE_PROVIDER_SITE_OTHER): Payer: BC Managed Care – PPO | Admitting: Urology

## 2021-09-07 VITALS — BP 133/82

## 2021-09-07 DIAGNOSIS — Z8546 Personal history of malignant neoplasm of prostate: Secondary | ICD-10-CM

## 2021-09-07 DIAGNOSIS — C61 Malignant neoplasm of prostate: Secondary | ICD-10-CM

## 2021-09-07 LAB — URINALYSIS, ROUTINE W REFLEX MICROSCOPIC
Bilirubin, UA: NEGATIVE
Glucose, UA: NEGATIVE
Ketones, UA: NEGATIVE
Leukocytes,UA: NEGATIVE
Nitrite, UA: NEGATIVE
Protein,UA: NEGATIVE
RBC, UA: NEGATIVE
Specific Gravity, UA: 1.025 (ref 1.005–1.030)
Urobilinogen, Ur: 0.2 mg/dL (ref 0.2–1.0)
pH, UA: 5.5 (ref 5.0–7.5)

## 2021-09-07 NOTE — Patient Instructions (Signed)

## 2021-09-07 NOTE — Progress Notes (Signed)
09/07/2021 9:13 AM   Edward Taylor 08/10/1955 [id number]  Referring provider: Claretta Fraise, MD [place],  [place] [address]  Followup prostate cancer   HPI: Edward Taylor is a 66yo here for followup for prostate cancer. PSA decreased to 0.2. He finished IMRT 6/12. IPSS 2 QOL 2 on no therapy. Nocturia 1x. He has mild hot flashes.    PMH: Past Medical History:  Diagnosis Date   Allergy    Basal cell carcinoma    GERD (gastroesophageal reflux disease)    Hyperlipidemia    Hypertension    Prostate cancer [facility])     Surgical History: Past Surgical History:  Procedure Laterality Date   COLONOSCOPY     GOLD SEED IMPLANT N/A 05/24/2021   Procedure: GOLD SEED IMPLANT;  Surgeon: Cleon Gustin, MD;  Location: [facility];  Service: Urology;  Laterality: N/A;   SPACE OAR INSTILLATION N/A 05/24/2021   Procedure: SPACE OAR INSTILLATION;  Surgeon: Cleon Gustin, MD;  Location: [facility];  Service: Urology;  Laterality: N/A;    Home Medications:  Allergies as of 09/07/2021   No Known Allergies      Medication List        Accurate as of September 07, 2021  9:13 AM. If you have any questions, ask your nurse or doctor.          cetirizine 10 MG tablet Commonly known as: ZYRTEC Take 10 mg by mouth daily.   famotidine 40 MG tablet Commonly known as: PEPCID TAKE 1 TABLET DAILY   fexofenadine-pseudoephedrine 60-120 MG 12 hr tablet Commonly known as: ALLEGRA-D Take 1 tablet by mouth daily as needed (allergies/congestion).   fluticasone 50 MCG/ACT nasal spray Commonly known as: FLONASE Place 1 spray into both nostrils daily. What changed: when to take this   lisinopril 20 MG tablet Commonly known as: ZESTRIL TAKE 1 TABLET DAILY   megestrol 20 MG tablet Commonly known as: MEGACE Take 1 tablet (20 mg total) by mouth 2 (two) times daily as needed.   meloxicam 15 MG tablet Commonly known as: MOBIC Take 15 mg by mouth daily as needed for pain.   metoprolol  succinate 100 MG 24 hr tablet Commonly known as: TOPROL-XL Take 1 tablet (100 mg total) by mouth daily. Take with or immediately following a meal.   Orgovyx 120 MG Tabs Generic drug: Relugolix Take 1 capsule by mouth daily.   sildenafil 20 MG tablet Commonly known as: REVATIO TAKE 2-5 PILLS AT ONCE BY MOUTH WITH EACH SEXUAL ENCOUNTER   simvastatin 40 MG tablet Commonly known as: ZOCOR TAKE 1 TABLET DAILY AT     6:00PM   traMADol 50 MG tablet Commonly known as: Ultram Take 1 tablet (50 mg total) by mouth every 6 (six) hours as needed.   trimethoprim-polymyxin b ophthalmic solution Commonly known as: POLYTRIM Place 1 drop into both eyes daily as needed for irritation.   Turmeric 500 MG Caps Take 500 mg by mouth daily.   Vitamin D 125 MCG (5000 UT) Caps Take 5,000 Units by mouth daily.   zinc gluconate 50 MG tablet Take 50 mg by mouth daily.        Allergies: No Known Allergies  Family History: Family History  Problem Relation Age of Onset   Cancer Mother    Hypertension Mother    Arthritis Father    Diabetes Father    Hyperlipidemia Father    Hypertension Father    Stroke Father    Heart disease  Maternal Grandfather    Heart disease Paternal Grandfather     Social History:  reports that he has never smoked. He has never used smokeless tobacco. He reports current alcohol use of about 4.0 standard drinks of alcohol per week. He reports that he does not use drugs.  ROS: All other review of systems were reviewed and are negative except what is noted above in HPI  Physical Exam: BP 133/82   Constitutional:  Alert and oriented, No acute distress. HEENT: [place] AT, moist mucus membranes.  Trachea midline, no masses. Cardiovascular: No clubbing, cyanosis, or edema. Respiratory: Normal respiratory effort, no increased work of breathing. GI: Abdomen is soft, nontender, nondistended, no abdominal masses GU: No CVA tenderness.  Lymph: No cervical or inguinal  lymphadenopathy. Skin: No rashes, bruises or suspicious lesions. Neurologic: Grossly intact, no focal deficits, moving all 4 extremities. Psychiatric: Normal mood and affect.  Laboratory Data: Lab Results  Component Value Date   WBC 8.3 11/20/2020   HGB 15.9 11/20/2020   HCT 46.8 11/20/2020   MCV 91 11/20/2020   PLT 213 11/20/2020    Lab Results  Component Value Date   CREATININE 0.80 03/30/2021    No results found for: "PSA"  Lab Results  Component Value Date   TESTOSTERONE <3 (L) 05/01/2021    No results found for: "HGBA1C"  Urinalysis    Component Value Date/Time   APPEARANCEUR Clear 06/08/2021 1216   GLUCOSEU Negative 06/08/2021 1216   BILIRUBINUR Negative 06/08/2021 1216   PROTEINUR Negative 06/08/2021 1216   NITRITE Negative 06/08/2021 1216   LEUKOCYTESUR Negative 06/08/2021 1216    Lab Results  Component Value Date   LABMICR Comment 06/08/2021   WBCUA None seen 04/04/2021   LABEPIT None seen 04/04/2021   MUCUS Present 04/04/2021   BACTERIA None seen 04/04/2021    Pertinent Imaging:  No results found for this or any previous visit.  No results found for this or any previous visit.  No results found for this or any previous visit.  No results found for this or any previous visit.  No results found for this or any previous visit.  No results found for this or any previous visit.  No results found for this or any previous visit.  No results found for this or any previous visit.   Assessment & Plan:    1. Prostate cancer ([place]) -RTC 3 months with PSA - Urinalysis, Routine w reflex microscopic   No follow-ups on file.  [name], MD  [facility]

## 2021-09-13 ENCOUNTER — Encounter: Payer: Self-pay | Admitting: *Deleted

## 2021-09-14 ENCOUNTER — Encounter: Payer: Self-pay | Admitting: *Deleted

## 2021-09-14 ENCOUNTER — Telehealth: Payer: Self-pay

## 2021-09-14 NOTE — Progress Notes (Signed)
   Received returned call from [facility]  confirming ADT medication and start date for patient.

## 2021-09-14 NOTE — Telephone Encounter (Signed)
Mrs. Hulan Fray form [place] called and had some questions regarding patients treatment for prostate cancer.   Her direct number is [phone].

## 2021-09-14 NOTE — Telephone Encounter (Signed)
Spoke with MS. Dove, she was confirming what medication Mr. Edward Taylor was taking for his prostate cancer, when he start the medication and how often is the medication is taken.

## 2021-10-04 ENCOUNTER — Other Ambulatory Visit: Payer: Self-pay

## 2021-10-04 ENCOUNTER — Inpatient Hospital Stay: Payer: BC Managed Care – PPO | Attending: Radiation Oncology | Admitting: *Deleted

## 2021-10-04 ENCOUNTER — Encounter: Payer: Self-pay | Admitting: *Deleted

## 2021-10-04 VITALS — BP 138/63 | HR 81 | Temp 97.0°F | Resp 18 | Ht 70.5 in | Wt 218.9 lb

## 2021-10-04 DIAGNOSIS — C61 Malignant neoplasm of prostate: Secondary | ICD-10-CM | POA: Insufficient documentation

## 2021-10-04 NOTE — Progress Notes (Signed)
Pt presents today for Survivorship Care Plan visit. Pt 's vitals are WNL. Pt denies pain but does rate fatigue a 8/10. Pt has just completed ADT on 09/30/2021. He does continue to have hot flashes and decreased stamina and energy. Pt hopes to get back to exercising once energy level increases.  Pt states, he does not have any urinary urgency, frequency and only gets up to bathroom at night once. Bowel movements are more frequent since radiation, but soft-formed.Pt denies smoking.Last seen PCP in April of this year. Last colonoscopy was 2017. Due 2027.Pt will see dermatologist in Oct of this year. He sees Dr. Tarri Glenn every 6 months for skin checks. Next PSA labs on Oct.31. We discussed healthier eating choices and exercise during this visit. Pt is please with his progress thus far.

## 2021-10-08 ENCOUNTER — Other Ambulatory Visit: Payer: Self-pay | Admitting: Family Medicine

## 2021-10-22 ENCOUNTER — Telehealth: Payer: Self-pay

## 2021-10-22 NOTE — Telephone Encounter (Signed)
[facility] called- asking for refill for Orgoyvx, please refill if patient will continue current therapy or if medication has been discontinued.

## 2021-10-23 ENCOUNTER — Other Ambulatory Visit: Payer: Self-pay | Admitting: Urology

## 2021-10-23 MED ORDER — ORGOVYX 120 MG PO TABS: 1.0000 | ORAL_TABLET | Freq: Every day | ORAL | 2 refills | Status: DC

## 2021-11-21 ENCOUNTER — Ambulatory Visit (INDEPENDENT_AMBULATORY_CARE_PROVIDER_SITE_OTHER): Payer: BC Managed Care – PPO | Admitting: Family Medicine

## 2021-11-21 ENCOUNTER — Encounter: Payer: Self-pay | Admitting: Family Medicine

## 2021-11-21 ENCOUNTER — Other Ambulatory Visit: Payer: Self-pay | Admitting: Family Medicine

## 2021-11-21 VITALS — BP 133/76 | HR 77 | Temp 97.5°F | Ht 70.5 in | Wt 213.8 lb

## 2021-11-21 DIAGNOSIS — I10 Essential (primary) hypertension: Secondary | ICD-10-CM

## 2021-11-21 DIAGNOSIS — Z125 Encounter for screening for malignant neoplasm of prostate: Secondary | ICD-10-CM | POA: Diagnosis not present

## 2021-11-21 DIAGNOSIS — C61 Malignant neoplasm of prostate: Secondary | ICD-10-CM

## 2021-11-21 DIAGNOSIS — E559 Vitamin D deficiency, unspecified: Secondary | ICD-10-CM | POA: Diagnosis not present

## 2021-11-21 DIAGNOSIS — E782 Mixed hyperlipidemia: Secondary | ICD-10-CM | POA: Diagnosis not present

## 2021-11-21 DIAGNOSIS — K5790 Diverticulosis of intestine, part unspecified, without perforation or abscess without bleeding: Secondary | ICD-10-CM | POA: Insufficient documentation

## 2021-11-21 MED ORDER — SILDENAFIL CITRATE 20 MG PO TABS: ORAL_TABLET | ORAL | 3 refills | Status: DC

## 2021-11-21 MED ORDER — LISINOPRIL 20 MG PO TABS: 20.0000 mg | ORAL_TABLET | Freq: Every day | ORAL | 3 refills | Status: DC

## 2021-11-21 MED ORDER — METOPROLOL SUCCINATE ER 100 MG PO TB24: 100.0000 mg | ORAL_TABLET | Freq: Every day | ORAL | 3 refills | Status: DC

## 2021-11-21 MED ORDER — SIMVASTATIN 40 MG PO TABS: ORAL_TABLET | ORAL | 3 refills | Status: DC

## 2021-11-21 MED ORDER — ROSUVASTATIN CALCIUM 20 MG PO TABS: 20.0000 mg | ORAL_TABLET | Freq: Every day | ORAL | 3 refills | Status: DC

## 2021-11-21 NOTE — Progress Notes (Signed)
Subjective:  Patient ID: Edward Taylor, male    DOB: 10/20/1955  Age: 66 y.o. MRN: [id number]  CC: Annual Exam   HPI Edward Taylor presents for Annual exam.  Testosterone deprivation for prostate ca was finished. Felt tired and weak.No libido. Down and out. All of this is better. Went through radiation as well. Now done with all tx. Libido is back. Energy improving, not back completed.      11/21/2021    8:06 AM 05/21/2021    8:12 AM 05/21/2021    8:03 AM  Depression screen PHQ 2/9  Decreased Interest 0 2 0  Down, Depressed, Hopeless 0 1 0  PHQ - 2 Score 0 3 0  Altered sleeping 0 3   Tired, decreased energy 1 2   Change in appetite 0 0   Feeling bad or failure about yourself  0 0   Trouble concentrating 0 0   Moving slowly or fidgety/restless 0 0   Suicidal thoughts 0 0   PHQ-9 Score 1 8   Difficult doing work/chores Not difficult at all Somewhat difficult     History Edward Taylor has a past medical history of Allergy, Basal cell carcinoma, GERD (gastroesophageal reflux disease), Hyperlipidemia, Hypertension, and Prostate cancer ([place]).   He has a past surgical history that includes Colonoscopy; Gold seed implant (N/A, 05/24/2021); and SPACE OAR INSTILLATION (N/A, 05/24/2021).   His family history includes Arthritis in his father; Cancer in his mother; Diabetes in his father; Heart disease in his maternal grandfather and paternal grandfather; Hyperlipidemia in his father; Hypertension in his father and mother; Stroke in his father.He reports that he has never smoked. He has never used smokeless tobacco. He reports current alcohol use of about 4.0 standard drinks of alcohol per week. He reports that he does not use drugs.    ROS Review of Systems  Constitutional:  Negative for activity change, fatigue and unexpected weight change.  HENT:  Negative for congestion, ear pain, hearing loss, postnasal drip and trouble swallowing.   Eyes:  Negative for pain and visual disturbance.   Respiratory:  Negative for cough, chest tightness and shortness of breath.   Cardiovascular:  Negative for chest pain, palpitations and leg swelling.  Gastrointestinal:  Negative for abdominal distention, abdominal pain, blood in stool, constipation, diarrhea, nausea and vomiting.  Endocrine: Negative for cold intolerance, heat intolerance and polydipsia.  Genitourinary:  Negative for difficulty urinating, dysuria, flank pain, frequency and urgency.  Musculoskeletal:  Negative for arthralgias and joint swelling.  Skin:  Negative for color change, rash and wound.  Neurological:  Negative for dizziness, syncope, speech difficulty, weakness, light-headedness, numbness and headaches.  Hematological:  Does not bruise/bleed easily.  Psychiatric/Behavioral:  Negative for confusion, decreased concentration, dysphoric mood and sleep disturbance. The patient is not nervous/anxious.     Objective:  BP 133/76   Pulse 77   Temp (!) 97.5 F (36.4 C)   Ht 5' 10.5" (1.791 m)   Wt 213 lb 12.8 oz (97 kg)   SpO2 97%   BMI 30.24 kg/m   BP Readings from Last 3 Encounters:  11/21/21 133/76  10/04/21 138/63  09/07/21 133/82    Wt Readings from Last 3 Encounters:  11/21/21 213 lb 12.8 oz (97 kg)  10/04/21 218 lb 14.4 oz (99.3 kg)  05/24/21 202 lb 13.2 oz (92 kg)     Physical Exam Constitutional:      Appearance: He is well-developed.  HENT:     Head: Normocephalic and atraumatic.  Eyes:  Pupils: Pupils are equal, round, and reactive to light.  Neck:     Thyroid: No thyromegaly.     Trachea: No tracheal deviation.  Cardiovascular:     Rate and Rhythm: Normal rate and regular rhythm.     Heart sounds: Normal heart sounds. No murmur heard.    No friction rub. No gallop.  Pulmonary:     Breath sounds: Normal breath sounds. No wheezing or rales.  Abdominal:     General: Bowel sounds are normal. There is no distension.     Palpations: Abdomen is soft. There is no mass.     Tenderness:  There is no abdominal tenderness.     Hernia: There is no hernia in the left inguinal area.  Genitourinary:    Penis: Normal.      Testes: Normal.  Musculoskeletal:        General: Normal range of motion.     Cervical back: Normal range of motion.  Lymphadenopathy:     Cervical: No cervical adenopathy.  Skin:    General: Skin is warm and dry.  Neurological:     Mental Status: He is alert and oriented to person, place, and time.       Assessment & Plan:   Edward Taylor was seen today for annual exam.  Diagnoses and all orders for this visit:  Mixed hyperlipidemia -     Lipid panel -     Discontinue: simvastatin (ZOCOR) 40 MG tablet; TAKE 1 TABLET DAILY AT     6:00PM  Essential hypertension -     CBC with Differential/Platelet -     CMP14+EGFR -     lisinopril (ZESTRIL) 20 MG tablet; Take 1 tablet (20 mg total) by mouth daily. -     metoprolol succinate (TOPROL-XL) 100 MG 24 hr tablet; Take 1 tablet (100 mg total) by mouth daily. Take with or immediately following a meal.  Vitamin D deficiency -     VITAMIN D 25 Hydroxy (Vit-D Deficiency, Fractures)  Screening for prostate cancer -     PSA, total and free  Primary hypertension  Malignant neoplasm of prostate ([place]) -     Testosterone,Free and Total  Diverticulosis  Other orders -     rosuvastatin (CRESTOR) 20 MG tablet; Take 1 tablet (20 mg total) by mouth daily. For cholesterol -     sildenafil (REVATIO) 20 MG tablet; TAKE 2-5 PILLS AT ONCE BY MOUTH WITH EACH SEXUAL ENCOUNTER       I have discontinued Edward Taylor "Mark"'s simvastatin, meloxicam, trimethoprim-polymyxin b, traMADol, megestrol, Orgovyx, and simvastatin. I have also changed his lisinopril. Additionally, I am having him start on rosuvastatin. Lastly, I am having him maintain his cetirizine, fluticasone, zinc gluconate, fexofenadine-pseudoephedrine, Vitamin D, Turmeric, famotidine, metoprolol succinate, and sildenafil.  Allergies as of 11/21/2021    No Known Allergies      Medication List        Accurate as of November 21, 2021  8:59 AM. If you have any questions, ask your nurse or doctor.          STOP taking these medications    megestrol 20 MG tablet Commonly known as: MEGACE Stopped by: Claretta Fraise, MD   meloxicam 15 MG tablet Commonly known as: MOBIC Stopped by: Claretta Fraise, MD   Orgovyx 120 MG Tabs Generic drug: Relugolix Stopped by: Claretta Fraise, MD   simvastatin 40 MG tablet Commonly known as: ZOCOR Stopped by: Claretta Fraise, MD   traMADol 50 MG tablet Commonly known  as: Ultram Stopped by: Claretta Fraise, MD   trimethoprim-polymyxin b ophthalmic solution Commonly known as: POLYTRIM Stopped by: Claretta Fraise, MD       TAKE these medications    cetirizine 10 MG tablet Commonly known as: ZYRTEC Take 10 mg by mouth daily.   famotidine 40 MG tablet Commonly known as: PEPCID TAKE 1 TABLET DAILY   fexofenadine-pseudoephedrine 60-120 MG 12 hr tablet Commonly known as: ALLEGRA-D Take 1 tablet by mouth daily as needed (allergies/congestion).   fluticasone 50 MCG/ACT nasal spray Commonly known as: FLONASE Place 1 spray into both nostrils daily. What changed: when to take this   lisinopril 20 MG tablet Commonly known as: ZESTRIL Take 1 tablet (20 mg total) by mouth daily.   metoprolol succinate 100 MG 24 hr tablet Commonly known as: TOPROL-XL Take 1 tablet (100 mg total) by mouth daily. Take with or immediately following a meal.   rosuvastatin 20 MG tablet Commonly known as: Crestor Take 1 tablet (20 mg total) by mouth daily. For cholesterol Started by: Claretta Fraise, MD   sildenafil 20 MG tablet Commonly known as: REVATIO TAKE 2-5 PILLS AT ONCE BY MOUTH WITH EACH SEXUAL ENCOUNTER   Turmeric 500 MG Caps Take 500 mg by mouth daily.   Vitamin D 125 MCG (5000 UT) Caps Take 5,000 Units by mouth daily.   zinc gluconate 50 MG tablet Take 50 mg by mouth daily.          Follow-up: No follow-ups on file.  Claretta Fraise, M.D.

## 2021-11-26 LAB — CBC WITH DIFFERENTIAL/PLATELET
Basophils Absolute: 0 10*3/uL (ref 0.0–0.2)
Basos: 1 %
EOS (ABSOLUTE): 0.1 10*3/uL (ref 0.0–0.4)
Eos: 3 %
Hematocrit: 41.5 % (ref 37.5–51.0)
Hemoglobin: 14.1 g/dL (ref 13.0–17.7)
Immature Grans (Abs): 0.1 10*3/uL (ref 0.0–0.1)
Immature Granulocytes: 2 %
Lymphocytes Absolute: 1 10*3/uL (ref 0.7–3.1)
Lymphs: 17 %
MCH: 31.2 pg (ref 26.6–33.0)
MCHC: 34 g/dL (ref 31.5–35.7)
MCV: 92 fL (ref 79–97)
Monocytes Absolute: 0.5 10*3/uL (ref 0.1–0.9)
Monocytes: 10 %
Neutrophils Absolute: 3.9 10*3/uL (ref 1.4–7.0)
Neutrophils: 67 %
Platelets: 212 10*3/uL (ref 150–450)
RBC: 4.52 x10E6/uL (ref 4.14–5.80)
RDW: 12.1 % (ref 11.6–15.4)
WBC: 5.7 10*3/uL (ref 3.4–10.8)

## 2021-11-26 LAB — PSA, TOTAL AND FREE
PSA, Free Pct: 10 %
PSA, Free: 0.04 ng/mL
Prostate Specific Ag, Serum: 0.4 ng/mL (ref 0.0–4.0)

## 2021-11-26 LAB — LIPID PANEL
Chol/HDL Ratio: 3.2 ratio (ref 0.0–5.0)
Cholesterol, Total: 127 mg/dL (ref 100–199)
HDL: 40 mg/dL (ref >39)
LDL Chol Calc (NIH): 65 mg/dL (ref 0–99)
Triglycerides: 125 mg/dL (ref 0–149)
VLDL Cholesterol Cal: 22 mg/dL (ref 5–40)

## 2021-11-26 LAB — CMP14+EGFR
ALT: 35 IU/L (ref 0–44)
AST: 23 IU/L (ref 0–40)
Albumin/Globulin Ratio: 2.4 — ABNORMAL HIGH (ref 1.2–2.2)
Albumin: 5 g/dL — ABNORMAL HIGH (ref 3.9–4.9)
Alkaline Phosphatase: 64 IU/L (ref 44–121)
BUN/Creatinine Ratio: 12 (ref 10–24)
BUN: 12 mg/dL (ref 8–27)
Bilirubin Total: 0.5 mg/dL (ref 0.0–1.2)
CO2: 24 mmol/L (ref 20–29)
Calcium: 9.4 mg/dL (ref 8.6–10.2)
Chloride: 100 mmol/L (ref 96–106)
Creatinine, Ser: 1.01 mg/dL (ref 0.76–1.27)
Globulin, Total: 2.1 g/dL (ref 1.5–4.5)
Glucose: 112 mg/dL — ABNORMAL HIGH (ref 70–99)
Potassium: 4.5 mmol/L (ref 3.5–5.2)
Sodium: 138 mmol/L (ref 134–144)
Total Protein: 7.1 g/dL (ref 6.0–8.5)
eGFR: 84 mL/min/{1.73_m2} (ref >59)

## 2021-11-26 LAB — TESTOSTERONE,FREE AND TOTAL
Testosterone, Free: 2.7 pg/mL — ABNORMAL LOW (ref 6.6–18.1)
Testosterone: 128 ng/dL — ABNORMAL LOW (ref 264–916)

## 2021-11-26 LAB — VITAMIN D 25 HYDROXY (VIT D DEFICIENCY, FRACTURES): Vit D, 25-Hydroxy: 46.7 ng/mL (ref 30.0–100.0)

## 2021-12-04 ENCOUNTER — Other Ambulatory Visit: Payer: BC Managed Care – PPO

## 2021-12-11 ENCOUNTER — Encounter: Payer: Self-pay | Admitting: Urology

## 2021-12-11 ENCOUNTER — Ambulatory Visit (INDEPENDENT_AMBULATORY_CARE_PROVIDER_SITE_OTHER): Payer: BC Managed Care – PPO | Admitting: Urology

## 2021-12-11 VITALS — BP 130/82 | HR 73

## 2021-12-11 DIAGNOSIS — N5201 Erectile dysfunction due to arterial insufficiency: Secondary | ICD-10-CM | POA: Diagnosis not present

## 2021-12-11 DIAGNOSIS — C61 Malignant neoplasm of prostate: Secondary | ICD-10-CM | POA: Diagnosis not present

## 2021-12-11 NOTE — Progress Notes (Signed)
12/11/2021 8:35 AM   Edward Taylor [birth year] [id number]  Referring provider: Claretta Fraise, MD [place],  [place] [address]  Followup prostate cancer   HPI: Mr Edward Taylor is a 66yo here for followup for prostate cancer. PSA 0.4 testosterone 128 off of orgovyx. No hot flashes. No significant LUTS. IPSS 1 QOL 0. He uses sildenafil prn for his erections with good results. No other complaints today   PMH: Past Medical History:  Diagnosis Date   Allergy    Basal cell carcinoma    GERD (gastroesophageal reflux disease)    Hyperlipidemia    Hypertension    Prostate cancer [facility])     Surgical History: Past Surgical History:  Procedure Laterality Date   COLONOSCOPY     GOLD SEED IMPLANT N/A 05/24/2021   Procedure: GOLD SEED IMPLANT;  Surgeon: Cleon Gustin, MD;  Location: [facility];  Service: Urology;  Laterality: N/A;   SPACE OAR INSTILLATION N/A 05/24/2021   Procedure: SPACE OAR INSTILLATION;  Surgeon: Cleon Gustin, MD;  Location: [facility];  Service: Urology;  Laterality: N/A;    Home Medications:  Allergies as of 12/11/2021   No Known Allergies      Medication List        Accurate as of December 11, 2021  8:35 AM. If you have any questions, ask your nurse or doctor.          cetirizine 10 MG tablet Commonly known as: ZYRTEC Take 10 mg by mouth daily.   Crestor 20 MG tablet Generic drug: rosuvastatin TAKE 1 TABLET DAILY FOR    CHOLESTEROL.   famotidine 40 MG tablet Commonly known as: PEPCID TAKE 1 TABLET DAILY   fexofenadine-pseudoephedrine 60-120 MG 12 hr tablet Commonly known as: ALLEGRA-D Take 1 tablet by mouth daily as needed (allergies/congestion).   fluticasone 50 MCG/ACT nasal spray Commonly known as: FLONASE Place 1 spray into both nostrils daily. What changed: when to take this   lisinopril 20 MG tablet Commonly known as: ZESTRIL Take 1 tablet (20 mg total) by mouth daily.   metoprolol succinate 100 MG 24 hr  tablet Commonly known as: TOPROL-XL Take 1 tablet (100 mg total) by mouth daily. Take with or immediately following a meal.   sildenafil 20 MG tablet Commonly known as: REVATIO TAKE 2-5 PILLS AT ONCE BY MOUTH WITH EACH SEXUAL ENCOUNTER   Turmeric 500 MG Caps Take 500 mg by mouth daily.   Vitamin D 125 MCG (5000 UT) Caps Take 5,000 Units by mouth daily.   zinc gluconate 50 MG tablet Take 50 mg by mouth daily.        Allergies: No Known Allergies  Family History: Family History  Problem Relation Age of Onset   Cancer Mother    Hypertension Mother    Arthritis Father    Diabetes Father    Hyperlipidemia Father    Hypertension Father    Stroke Father    Heart disease Maternal Grandfather    Heart disease Paternal Grandfather     Social History:  reports that he has never smoked. He has never used smokeless tobacco. He reports current alcohol use of about 4.0 standard drinks of alcohol per week. He reports that he does not use drugs.  ROS: All other review of systems were reviewed and are negative except what is noted above in HPI  Physical Exam: BP 130/82   Pulse 73   Constitutional:  Alert and oriented, No acute distress. HEENT: [place] AT, moist mucus  membranes.  Trachea midline, no masses. Cardiovascular: No clubbing, cyanosis, or edema. Respiratory: Normal respiratory effort, no increased work of breathing. GI: Abdomen is soft, nontender, nondistended, no abdominal masses GU: No CVA tenderness.  Lymph: No cervical or inguinal lymphadenopathy. Skin: No rashes, bruises or suspicious lesions. Neurologic: Grossly intact, no focal deficits, moving all 4 extremities. Psychiatric: Normal mood and affect.  Laboratory Data: Lab Results  Component Value Date   WBC 5.7 11/21/2021   HGB 14.1 11/21/2021   HCT 41.5 11/21/2021   MCV 92 11/21/2021   PLT 212 11/21/2021    Lab Results  Component Value Date   CREATININE 1.01 11/21/2021    No results found for:  "PSA"  Lab Results  Component Value Date   TESTOSTERONE 128 (L) 11/21/2021    No results found for: "HGBA1C"  Urinalysis    Component Value Date/Time   APPEARANCEUR Clear 09/07/2021 0912   GLUCOSEU Negative 09/07/2021 0912   BILIRUBINUR Negative 09/07/2021 0912   PROTEINUR Negative 09/07/2021 0912   NITRITE Negative 09/07/2021 0912   LEUKOCYTESUR Negative 09/07/2021 0912    Lab Results  Component Value Date   LABMICR Comment 09/07/2021   WBCUA None seen 04/04/2021   LABEPIT None seen 04/04/2021   MUCUS Present 04/04/2021   BACTERIA None seen 04/04/2021    Pertinent Imaging:  No results found for this or any previous visit.  No results found for this or any previous visit.  No results found for this or any previous visit.  No results found for this or any previous visit.  No results found for this or any previous visit.  No valid procedures specified. No results found for this or any previous visit.  No results found for this or any previous visit.   Assessment & Plan:    1. Prostate cancer ([place]) -RTC 3 months with PSA - Urinalysis, Routine w reflex microscopic - PSA; Future  2. Erectile dysfunction -Continue sildenafil prn   No follow-ups on file.  [name], MD  [facility] [place]

## 2021-12-11 NOTE — Patient Instructions (Signed)

## 2021-12-12 LAB — URINALYSIS, ROUTINE W REFLEX MICROSCOPIC
Bilirubin, UA: NEGATIVE
Glucose, UA: NEGATIVE
Ketones, UA: NEGATIVE
Leukocytes,UA: NEGATIVE
Nitrite, UA: NEGATIVE
Protein,UA: NEGATIVE
RBC, UA: NEGATIVE
Specific Gravity, UA: 1.01 (ref 1.005–1.030)
Urobilinogen, Ur: 0.2 mg/dL (ref 0.2–1.0)
pH, UA: 5.5 (ref 5.0–7.5)

## 2021-12-24 ENCOUNTER — Encounter: Payer: Self-pay | Admitting: Family Medicine

## 2021-12-26 ENCOUNTER — Other Ambulatory Visit: Payer: Self-pay | Admitting: Family Medicine

## 2021-12-26 MED ORDER — SILDENAFIL CITRATE 20 MG PO TABS: ORAL_TABLET | ORAL | 3 refills | Status: DC

## 2022-01-06 ENCOUNTER — Other Ambulatory Visit: Payer: Self-pay | Admitting: Family Medicine

## 2022-01-29 ENCOUNTER — Other Ambulatory Visit: Payer: Self-pay | Admitting: Family Medicine

## 2022-01-29 DIAGNOSIS — I10 Essential (primary) hypertension: Secondary | ICD-10-CM

## 2022-03-06 ENCOUNTER — Other Ambulatory Visit: Payer: Self-pay

## 2022-03-06 ENCOUNTER — Other Ambulatory Visit: Payer: BC Managed Care – PPO

## 2022-03-06 DIAGNOSIS — C61 Malignant neoplasm of prostate: Secondary | ICD-10-CM

## 2022-03-07 LAB — PSA: Prostate Specific Ag, Serum: 0.3 ng/mL (ref 0.0–4.0)

## 2022-03-08 ENCOUNTER — Ambulatory Visit (INDEPENDENT_AMBULATORY_CARE_PROVIDER_SITE_OTHER): Payer: BC Managed Care – PPO | Admitting: Urology

## 2022-03-08 ENCOUNTER — Encounter: Payer: Self-pay | Admitting: Urology

## 2022-03-08 VITALS — BP 142/80 | HR 79

## 2022-03-08 DIAGNOSIS — C61 Malignant neoplasm of prostate: Secondary | ICD-10-CM

## 2022-03-08 DIAGNOSIS — N5201 Erectile dysfunction due to arterial insufficiency: Secondary | ICD-10-CM

## 2022-03-08 LAB — URINALYSIS, ROUTINE W REFLEX MICROSCOPIC
Bilirubin, UA: NEGATIVE
Glucose, UA: NEGATIVE
Ketones, UA: NEGATIVE
Leukocytes,UA: NEGATIVE
Nitrite, UA: NEGATIVE
RBC, UA: NEGATIVE
Specific Gravity, UA: 1.03 (ref 1.005–1.030)
Urobilinogen, Ur: 0.2 mg/dL (ref 0.2–1.0)
pH, UA: 5 (ref 5.0–7.5)

## 2022-03-08 MED ORDER — SILDENAFIL CITRATE 20 MG PO TABS: ORAL_TABLET | ORAL | 3 refills | Status: DC

## 2022-03-08 NOTE — Patient Instructions (Signed)

## 2022-03-08 NOTE — Progress Notes (Signed)
03/08/2022 9:10 AM   Edward Taylor 13-Dec-1955 [id number]  Referring provider: Claretta Fraise, MD [place],  [place] [address]  Followup Prostate cancer and erectile dysfunction   HPI: Mr Edward Taylor is a 67yo here for followup for prostate cancer and erectile dysfunction. PSA 0.3 off of ADT. He uses sildenafil prn which works well for his erections.    PMH: Past Medical History:  Diagnosis Date   Allergy    Basal cell carcinoma    GERD (gastroesophageal reflux disease)    Hyperlipidemia    Hypertension    Prostate cancer [facility])     Surgical History: Past Surgical History:  Procedure Laterality Date   COLONOSCOPY     GOLD SEED IMPLANT N/A 05/24/2021   Procedure: GOLD SEED IMPLANT;  Surgeon: Cleon Gustin, MD;  Location: [facility];  Service: Urology;  Laterality: N/A;   SPACE OAR INSTILLATION N/A 05/24/2021   Procedure: SPACE OAR INSTILLATION;  Surgeon: Cleon Gustin, MD;  Location: [facility];  Service: Urology;  Laterality: N/A;    Home Medications:  Allergies as of 03/08/2022   No Known Allergies      Medication List        Accurate as of March 08, 2022  9:10 AM. If you have any questions, ask your nurse or doctor.          cetirizine 10 MG tablet Commonly known as: ZYRTEC Take 10 mg by mouth daily.   Crestor 20 MG tablet Generic drug: rosuvastatin TAKE 1 TABLET DAILY FOR    CHOLESTEROL.   famotidine 40 MG tablet Commonly known as: PEPCID TAKE 1 TABLET DAILY   fexofenadine-pseudoephedrine 60-120 MG 12 hr tablet Commonly known as: ALLEGRA-D Take 1 tablet by mouth daily as needed (allergies/congestion).   fluticasone 50 MCG/ACT nasal spray Commonly known as: FLONASE Place 1 spray into both nostrils daily. What changed: when to take this   lisinopril 20 MG tablet Commonly known as: ZESTRIL TAKE 1 TABLET DAILY   metoprolol succinate 100 MG 24 hr tablet Commonly known as: TOPROL-XL Take 1 tablet (100 mg total) by mouth daily. Take  with or immediately following a meal.   sildenafil 20 MG tablet Commonly known as: REVATIO TAKE 2-5 PILLS AT ONCE BY MOUTH WITH EACH SEXUAL ENCOUNTER   Turmeric 500 MG Caps Take 500 mg by mouth daily.   Vitamin D 125 MCG (5000 UT) Caps Take 5,000 Units by mouth daily.   zinc gluconate 50 MG tablet Take 50 mg by mouth daily.        Allergies: No Known Allergies  Family History: Family History  Problem Relation Age of Onset   Cancer Mother    Hypertension Mother    Arthritis Father    Diabetes Father    Hyperlipidemia Father    Hypertension Father    Stroke Father    Heart disease Maternal Grandfather    Heart disease Paternal Grandfather     Social History:  reports that he has never smoked. He has never used smokeless tobacco. He reports current alcohol use of about 4.0 standard drinks of alcohol per week. He reports that he does not use drugs.  ROS: All other review of systems were reviewed and are negative except what is noted above in HPI  Physical Exam: BP (!) 142/80   Pulse 79   Constitutional:  Alert and oriented, No acute distress. HEENT: [place] AT, moist mucus membranes.  Trachea midline, no masses. Cardiovascular: No clubbing, cyanosis, or edema. Respiratory:  Normal respiratory effort, no increased work of breathing. GI: Abdomen is soft, nontender, nondistended, no abdominal masses GU: No CVA tenderness.  Lymph: No cervical or inguinal lymphadenopathy. Skin: No rashes, bruises or suspicious lesions. Neurologic: Grossly intact, no focal deficits, moving all 4 extremities. Psychiatric: Normal mood and affect.  Laboratory Data: Lab Results  Component Value Date   WBC 5.7 11/21/2021   HGB 14.1 11/21/2021   HCT 41.5 11/21/2021   MCV 92 11/21/2021   PLT 212 11/21/2021    Lab Results  Component Value Date   CREATININE 1.01 11/21/2021    No results found for: "PSA"  Lab Results  Component Value Date   TESTOSTERONE 128 (L) 11/21/2021    No  results found for: "HGBA1C"  Urinalysis    Component Value Date/Time   APPEARANCEUR Clear 12/11/2021 1115   GLUCOSEU Negative 12/11/2021 1115   BILIRUBINUR Negative 12/11/2021 1115   PROTEINUR Negative 12/11/2021 1115   NITRITE Negative 12/11/2021 1115   LEUKOCYTESUR Negative 12/11/2021 1115    Lab Results  Component Value Date   LABMICR Comment 12/11/2021   WBCUA None seen 04/04/2021   LABEPIT None seen 04/04/2021   MUCUS Present 04/04/2021   BACTERIA None seen 04/04/2021    Pertinent Imaging:  No results found for this or any previous visit.  No results found for this or any previous visit.  No results found for this or any previous visit.  No results found for this or any previous visit.  No results found for this or any previous visit.  No valid procedures specified. No results found for this or any previous visit.  No results found for this or any previous visit.   Assessment & Plan:    1. Prostate cancer ([place]) Followup 3 months with PSA - Urinalysis, Routine w reflex microscopic  2. Erectile dysfunction due to arterial insufficiency -sildenafil prn   No follow-ups on file.  [name], MD  [facility] [place]

## 2022-03-13 ENCOUNTER — Ambulatory Visit: Payer: BC Managed Care – PPO | Admitting: Urology

## 2022-05-16 ENCOUNTER — Telehealth: Payer: Self-pay | Admitting: Family Medicine

## 2022-05-16 ENCOUNTER — Other Ambulatory Visit: Payer: Self-pay | Admitting: *Deleted

## 2022-05-16 DIAGNOSIS — I10 Essential (primary) hypertension: Secondary | ICD-10-CM

## 2022-05-16 DIAGNOSIS — E782 Mixed hyperlipidemia: Secondary | ICD-10-CM

## 2022-05-16 DIAGNOSIS — E349 Endocrine disorder, unspecified: Secondary | ICD-10-CM

## 2022-05-16 NOTE — Telephone Encounter (Signed)
Labs ordered.

## 2022-05-22 ENCOUNTER — Other Ambulatory Visit: Payer: Self-pay | Admitting: Family Medicine

## 2022-05-22 ENCOUNTER — Ambulatory Visit: Payer: 59 | Admitting: Family Medicine

## 2022-05-22 ENCOUNTER — Encounter: Payer: Self-pay | Admitting: Family Medicine

## 2022-05-22 VITALS — BP 123/68 | HR 66 | Temp 96.9°F | Ht 70.5 in | Wt 218.8 lb

## 2022-05-22 DIAGNOSIS — I10 Essential (primary) hypertension: Secondary | ICD-10-CM

## 2022-05-22 DIAGNOSIS — K5790 Diverticulosis of intestine, part unspecified, without perforation or abscess without bleeding: Secondary | ICD-10-CM

## 2022-05-22 DIAGNOSIS — C61 Malignant neoplasm of prostate: Secondary | ICD-10-CM | POA: Diagnosis not present

## 2022-05-22 DIAGNOSIS — E782 Mixed hyperlipidemia: Secondary | ICD-10-CM

## 2022-05-22 LAB — PSA, TOTAL AND FREE

## 2022-05-22 MED ORDER — SEMAGLUTIDE-WEIGHT MANAGEMENT 0.25 MG/0.5ML ~~LOC~~ SOAJ: 0.2500 mg | SUBCUTANEOUS | 0 refills | Status: AC

## 2022-05-22 MED ORDER — SEMAGLUTIDE-WEIGHT MANAGEMENT 0.5 MG/0.5ML ~~LOC~~ SOAJ: 0.5000 mg | SUBCUTANEOUS | 0 refills | Status: AC

## 2022-05-22 MED ORDER — SEMAGLUTIDE-WEIGHT MANAGEMENT 1 MG/0.5ML ~~LOC~~ SOAJ: 1.0000 mg | SUBCUTANEOUS | 1 refills | Status: DC

## 2022-05-22 NOTE — Progress Notes (Signed)
Subjective:  Patient ID: Edward Taylor, male    DOB: 1956/01/03  Age: 67 y.o. MRN: [id number]  CC: Medical Management of Chronic Issues   HPI Edward Taylor presents for follow-up of elevated cholesterol. Doing well without complaints on current medication. Denies side effects of statin including myalgia and arthralgia and nausea. Also in today for liver function testing. Currently no chest pain, shortness of breath or other cardiovascular related symptoms noted.  Followed by Urology, Dr. Ronne Binning, for prostate Ca. PSA at 0.3. Due for recheck soon.    presents for  follow-up of hypertension. Patient has no history of headache chest pain or shortness of breath or recent cough. Patient also denies symptoms of TIA such as focal numbness or weakness. Patient denies side effects from medication. States taking it regularly.  Pt. Lost his job. Has severance for this year. Has $48million saved.  History Edward Taylor has a past medical history of Allergy, Basal cell carcinoma, GERD (gastroesophageal reflux disease), Hyperlipidemia, Hypertension, and Prostate cancer.   Edward Taylor has a past surgical history that includes Colonoscopy; Gold seed implant (N/A, 05/24/2021); and SPACE OAR INSTILLATION (N/A, 05/24/2021).   His family history includes Arthritis in his father; Cancer in his mother; Diabetes in his father; Heart disease in his maternal grandfather and paternal grandfather; Hyperlipidemia in his father; Hypertension in his father and mother; Stroke in his father.Edward Taylor reports that Edward Taylor has never smoked. Edward Taylor has never used smokeless tobacco. Edward Taylor reports current alcohol use of about 4.0 standard drinks of alcohol per week. Edward Taylor reports that Edward Taylor does not use drugs.  Current Outpatient Medications on File Prior to Visit  Medication Sig Dispense Refill   cetirizine (ZYRTEC) 10 MG tablet Take 10 mg by mouth daily.     Cholecalciferol (VITAMIN D) 125 MCG (5000 UT) CAPS Take 5,000 Units by mouth daily.     CRESTOR 20 MG  tablet TAKE 1 TABLET DAILY FOR    CHOLESTEROL. 90 tablet 3   famotidine (PEPCID) 40 MG tablet TAKE 1 TABLET DAILY 90 tablet 1   fexofenadine-pseudoephedrine (ALLEGRA-D) 60-120 MG 12 hr tablet Take 1 tablet by mouth daily as needed (allergies/congestion).     fluticasone (FLONASE) 50 MCG/ACT nasal spray Place 1 spray into both nostrils daily. (Patient taking differently: Place 1 spray into both nostrils at bedtime.) 150 g 1   lisinopril (ZESTRIL) 20 MG tablet TAKE 1 TABLET DAILY 90 tablet 3   metoprolol succinate (TOPROL-XL) 100 MG 24 hr tablet Take 1 tablet (100 mg total) by mouth daily. Take with or immediately following a meal. 90 tablet 3   sildenafil (REVATIO) 20 MG tablet TAKE 2-5 PILLS AT ONCE BY MOUTH WITH EACH SEXUAL ENCOUNTER 100 tablet 3   Turmeric 500 MG CAPS Take 500 mg by mouth daily.     zinc gluconate 50 MG tablet Take 50 mg by mouth daily.     No current facility-administered medications on file prior to visit.    ROS Review of Systems  Constitutional:  Negative for fever.  Respiratory:  Negative for shortness of breath.   Cardiovascular:  Negative for chest pain.  Musculoskeletal:  Negative for arthralgias.  Skin:  Negative for rash.    Objective:  BP 123/68   Pulse 66   Temp (!) 96.9 F (36.1 C)   Ht 5' 10.5" (1.791 m)   Wt 218 lb 12.8 oz (99.2 kg)   SpO2 96%   BMI 30.95 kg/m   BP Readings from Last 3 Encounters:  05/22/22 123/68  03/08/22 Marland Kitchen)  142/80  12/11/21 130/82    Wt Readings from Last 3 Encounters:  05/22/22 218 lb 12.8 oz (99.2 kg)  11/21/21 213 lb 12.8 oz (97 kg)  10/04/21 218 lb 14.4 oz (99.3 kg)     Physical Exam Vitals reviewed.  Constitutional:      Appearance: Edward Taylor is well-developed.  HENT:     Head: Normocephalic and atraumatic.     Right Ear: External ear normal.     Left Ear: External ear normal.     Mouth/Throat:     Pharynx: No oropharyngeal exudate or posterior oropharyngeal erythema.  Eyes:     Pupils: Pupils are equal,  round, and reactive to light.  Cardiovascular:     Rate and Rhythm: Normal rate and regular rhythm.     Heart sounds: No murmur heard. Pulmonary:     Effort: No respiratory distress.     Breath sounds: Normal breath sounds.  Musculoskeletal:     Cervical back: Normal range of motion and neck supple.  Neurological:     Mental Status: Edward Taylor is alert and oriented to person, place, and time.     No results found for: "HGBA1C"  Lab Results  Component Value Date   WBC 5.7 11/21/2021   HGB 14.1 11/21/2021   HCT 41.5 11/21/2021   PLT 212 11/21/2021   GLUCOSE 112 (H) 11/21/2021   CHOL 127 11/21/2021   TRIG 125 11/21/2021   HDL 40 11/21/2021   LDLCALC 65 11/21/2021   ALT 35 11/21/2021   AST 23 11/21/2021   NA 138 11/21/2021   K 4.5 11/21/2021   CL 100 11/21/2021   CREATININE 1.01 11/21/2021   BUN 12 11/21/2021   CO2 24 11/21/2021   TSH 2.590 04/10/2018    No results found.  Assessment & Plan:   Edward Taylor was seen today for medical management of chronic issues.  Diagnoses and all orders for this visit:  Mixed hyperlipidemia -     CMP14+EGFR -     Lipid panel  Primary hypertension -     CMP14+EGFR -     Lipid panel  Malignant neoplasm of prostate  Diverticulosis  Other orders -     Semaglutide-Weight Management 0.25 MG/0.5ML SOAJ; Inject 0.25 mg into the skin once a week for 28 days. -     Semaglutide-Weight Management 0.5 MG/0.5ML SOAJ; Inject 0.5 mg into the skin once a week for 28 days. -     Semaglutide-Weight Management 1 MG/0.5ML SOAJ; Inject 1 mg into the skin once a week.   Edward Taylor am having Edward Birkenhead "Edward Taylor" start on Semaglutide-Weight Management, Semaglutide-Weight Management, and Semaglutide-Weight Management. Edward Taylor am also having Edward Taylor maintain his cetirizine, fluticasone, zinc gluconate, fexofenadine-pseudoephedrine, Vitamin D, Turmeric, metoprolol succinate, Crestor, famotidine, lisinopril, and sildenafil.  Meds ordered this encounter  Medications    Semaglutide-Weight Management 0.25 MG/0.5ML SOAJ    Sig: Inject 0.25 mg into the skin once a week for 28 days.    Dispense:  2 mL    Refill:  0   Semaglutide-Weight Management 0.5 MG/0.5ML SOAJ    Sig: Inject 0.5 mg into the skin once a week for 28 days.    Dispense:  2 mL    Refill:  0   Semaglutide-Weight Management 1 MG/0.5ML SOAJ    Sig: Inject 1 mg into the skin once a week.    Dispense:  2 mL    Refill:  1     Follow-up: No follow-ups on file.  Mechele Claude, M.D.

## 2022-05-22 NOTE — Addendum Note (Signed)
Addended by: Mechele Claude on: 05/22/2022 08:30 AM   Modules accepted: Orders

## 2022-05-22 NOTE — Telephone Encounter (Signed)
WEGOVY 0.25 MG/0.5ML SOAJ        Changed from: Semaglutide-Weight Management 0.25 MG/0.5ML SOAJ    Pharmacy comment: Alternative Requested:NOT COVERED BY INSURANCE.

## 2022-05-23 ENCOUNTER — Other Ambulatory Visit: Payer: Self-pay | Admitting: Family Medicine

## 2022-05-23 ENCOUNTER — Encounter: Payer: Self-pay | Admitting: Family Medicine

## 2022-05-23 DIAGNOSIS — I10 Essential (primary) hypertension: Secondary | ICD-10-CM

## 2022-05-23 LAB — CMP14+EGFR
ALT: 34 IU/L (ref 0–44)
AST: 27 IU/L (ref 0–40)
Albumin/Globulin Ratio: 1.8 (ref 1.2–2.2)
Albumin: 4.6 g/dL (ref 3.9–4.9)
Alkaline Phosphatase: 72 IU/L (ref 44–121)
BUN/Creatinine Ratio: 18 (ref 10–24)
BUN: 18 mg/dL (ref 8–27)
Bilirubin Total: 0.5 mg/dL (ref 0.0–1.2)
CO2: 23 mmol/L (ref 20–29)
Calcium: 9.4 mg/dL (ref 8.6–10.2)
Chloride: 102 mmol/L (ref 96–106)
Creatinine, Ser: 0.99 mg/dL (ref 0.76–1.27)
Globulin, Total: 2.5 g/dL (ref 1.5–4.5)
Glucose: 109 mg/dL — ABNORMAL HIGH (ref 70–99)
Potassium: 4.5 mmol/L (ref 3.5–5.2)
Sodium: 140 mmol/L (ref 134–144)
Total Protein: 7.1 g/dL (ref 6.0–8.5)
eGFR: 85 mL/min/{1.73_m2} (ref >59)

## 2022-05-23 LAB — LIPID PANEL
Chol/HDL Ratio: 2.8 ratio (ref 0.0–5.0)
Cholesterol, Total: 130 mg/dL (ref 100–199)
HDL: 46 mg/dL (ref >39)
LDL Chol Calc (NIH): 61 mg/dL (ref 0–99)
Triglycerides: 132 mg/dL (ref 0–149)
VLDL Cholesterol Cal: 23 mg/dL (ref 5–40)

## 2022-05-23 LAB — PSA, TOTAL AND FREE: PSA, Free: <0.02 ng/mL

## 2022-05-23 MED ORDER — ROSUVASTATIN CALCIUM 20 MG PO TABS: 20.0000 mg | ORAL_TABLET | Freq: Every day | ORAL | 3 refills | Status: DC

## 2022-05-23 MED ORDER — METOPROLOL SUCCINATE ER 100 MG PO TB24: 100.0000 mg | ORAL_TABLET | Freq: Every day | ORAL | 3 refills | Status: DC

## 2022-05-23 MED ORDER — LISINOPRIL 20 MG PO TABS: 20.0000 mg | ORAL_TABLET | Freq: Every day | ORAL | 3 refills | Status: DC

## 2022-05-23 NOTE — Progress Notes (Signed)
Hello Edward Taylor,  Your lab result is normal and/or stable.Some minor variations that are not significant are commonly marked abnormal, but do not represent any medical problem for you.  Best regards, Arnell Mausolf, M.D.

## 2022-05-27 ENCOUNTER — Encounter: Payer: Self-pay | Admitting: Urology

## 2022-05-27 ENCOUNTER — Ambulatory Visit (INDEPENDENT_AMBULATORY_CARE_PROVIDER_SITE_OTHER): Payer: 59 | Admitting: Urology

## 2022-05-27 VITALS — BP 145/83 | HR 79

## 2022-05-27 DIAGNOSIS — N5201 Erectile dysfunction due to arterial insufficiency: Secondary | ICD-10-CM | POA: Diagnosis not present

## 2022-05-27 DIAGNOSIS — C61 Malignant neoplasm of prostate: Secondary | ICD-10-CM

## 2022-05-27 NOTE — Progress Notes (Signed)
05/27/2022 1:31 PM   Edward Taylor February 20, 1955 [id number]  Referring provider: Mechele Claude, MD [address] [place],  [place] [address]  Followup prostate cancer and erectile dysfunction   HPI: Edward Taylor is a 66yo here for followup for prostate cancer and erectile dysfunction. He finished ADT 8 months ago. PSA 0.2. Radiation therapy finished 1 year ago. He has stable Erectile dysfunction for which he takes sildenafil   PMH: Past Medical History:  Diagnosis Date   Allergy    Basal cell carcinoma    GERD (gastroesophageal reflux disease)    Hyperlipidemia    Hypertension    Prostate cancer     Surgical History: Past Surgical History:  Procedure Laterality Date   COLONOSCOPY     GOLD SEED IMPLANT N/A 05/24/2021   Procedure: GOLD SEED IMPLANT;  Surgeon: Malen Gauze, MD;  Location: [facility];  Service: Urology;  Laterality: N/A;   SPACE OAR INSTILLATION N/A 05/24/2021   Procedure: SPACE OAR INSTILLATION;  Surgeon: Malen Gauze, MD;  Location: [facility];  Service: Urology;  Laterality: N/A;    Home Medications:  Allergies as of 05/27/2022   No Known Allergies      Medication List        Accurate as of May 27, 2022  1:31 PM. If you have any questions, ask your nurse or doctor.          cetirizine 10 MG tablet Commonly known as: ZYRTEC Take 10 mg by mouth daily.   famotidine 40 MG tablet Commonly known as: PEPCID TAKE 1 TABLET DAILY   fexofenadine-pseudoephedrine 60-120 MG 12 hr tablet Commonly known as: ALLEGRA-D Take 1 tablet by mouth daily as needed (allergies/congestion).   fluticasone 50 MCG/ACT nasal spray Commonly known as: FLONASE Place 1 spray into both nostrils daily. What changed: when to take this   lisinopril 20 MG tablet Commonly known as: ZESTRIL Take 1 tablet (20 mg total) by mouth daily.   metoprolol succinate 100 MG 24 hr tablet Commonly known as: TOPROL-XL Take 1 tablet (100 mg total) by mouth daily. Take with or  immediately following a meal.   rosuvastatin 20 MG tablet Commonly known as: Crestor Take 1 tablet (20 mg total) by mouth daily. for cholesterol   Semaglutide-Weight Management 0.25 MG/0.5ML Soaj Inject 0.25 mg into the skin once a week for 28 days.   Semaglutide-Weight Management 0.5 MG/0.5ML Soaj Inject 0.5 mg into the skin once a week for 28 days. Start taking on: Jun 20, 2022   Semaglutide-Weight Management 1 MG/0.5ML Soaj Inject 1 mg into the skin once a week. Start taking on: July 19, 2022   sildenafil 20 MG tablet Commonly known as: REVATIO TAKE 2-5 PILLS Taylor ONCE BY MOUTH WITH EACH SEXUAL ENCOUNTER   Turmeric 500 MG Caps Take 500 mg by mouth daily.   Vitamin D 125 MCG (5000 UT) Caps Take 5,000 Units by mouth daily.   zinc gluconate 50 MG tablet Take 50 mg by mouth daily.        Allergies: No Known Allergies  Family History: Family History  Problem Relation Age of Onset   Cancer Mother    Hypertension Mother    Arthritis Father    Diabetes Father    Hyperlipidemia Father    Hypertension Father    Stroke Father    Heart disease Maternal Grandfather    Heart disease Paternal Grandfather     Social History:  reports that he has never smoked. He has never used  smokeless tobacco. He reports current alcohol use of about 4.0 standard drinks of alcohol per week. He reports that he does not use drugs.  ROS: All other review of systems were reviewed and are negative except what is noted above in HPI  Physical Exam: BP (!) 145/83   Pulse 79   Constitutional:  Alert and oriented, No acute distress. HEENT: Edward Taylor, moist mucus membranes.  Trachea midline, no masses. Cardiovascular: No clubbing, cyanosis, or edema. Respiratory: Normal respiratory effort, no increased work of breathing. GI: Abdomen is soft, nontender, nondistended, no abdominal masses GU: No CVA tenderness.  Lymph: No cervical or inguinal lymphadenopathy. Skin: No rashes, bruises or suspicious  lesions. Neurologic: Grossly intact, no focal deficits, moving all 4 extremities. Psychiatric: Normal mood and affect.  Laboratory Data: Lab Results  Component Value Date   WBC 5.7 11/21/2021   HGB 14.1 11/21/2021   HCT 41.5 11/21/2021   MCV 92 11/21/2021   PLT 212 11/21/2021    Lab Results  Component Value Date   CREATININE 0.99 05/22/2022    No results found for: "PSA"  Lab Results  Component Value Date   TESTOSTERONE 128 (L) 11/21/2021    No results found for: "HGBA1C"  Urinalysis    Component Value Date/Time   APPEARANCEUR Clear 03/08/2022 0842   GLUCOSEU Negative 03/08/2022 0842   BILIRUBINUR Negative 03/08/2022 0842   PROTEINUR Trace 03/08/2022 0842   NITRITE Negative 03/08/2022 0842   LEUKOCYTESUR Negative 03/08/2022 0842    Lab Results  Component Value Date   LABMICR Comment 03/08/2022   WBCUA None seen 04/04/2021   LABEPIT None seen 04/04/2021   MUCUS Present 04/04/2021   BACTERIA None seen 04/04/2021    Pertinent Imaging:  No results found for this or any previous visit.  No results found for this or any previous visit.  No results found for this or any previous visit.  No results found for this or any previous visit.  No results found for this or any previous visit.  No valid procedures specified. No results found for this or any previous visit.  No results found for this or any previous visit.   Assessment & Plan:    1. Prostate cancer -followup PSA in 6 months  2. Erectile dysfunction due to arterial insufficiency -sildenafil prn   No follow-ups on file.  [name], MD  [facility] [place]

## 2022-05-27 NOTE — Patient Instructions (Signed)
Prostate Cancer  The prostate is a small gland that produces fluid that makes up semen (seminal fluid). It is located below the bladder in men, in front of the rectum. Prostate cancer is the abnormal growth of cells in the prostate gland. What are the causes? The exact cause of this condition is not known. What increases the risk? You are more likely to develop this condition if: You are 65 years of age or older. You have a family history of prostate cancer. You have a family history of breast and ovarian cancer. You have genes that are passed from parent to child (inherited), such as BRCA1 and BRCA2. You have Lynch syndrome. African American men and men of African descent are diagnosed with prostate cancer at higher rates than other men. The reasons for this are not well understood and are likely due to a combination of genetic and environmental factors. What are the signs or symptoms? Symptoms of this condition include: Problems with urination. This may include: A weak or interrupted flow of urine. Trouble starting or stopping urination. Trouble emptying the bladder all the way. The need to urinate more often, especially at night. Blood in urine or semen. Persistent pain or discomfort in the lower back, lower abdomen, or hips. Trouble getting an erection. Weakness or numbness in the legs or feet. How is this diagnosed? This condition can be diagnosed with: A digital rectal exam. For this exam, a health care provider inserts a gloved finger into the rectum to feel the prostate gland. A blood test called a prostate-specific antigen (PSA) test. A procedure in which a sample of tissue is taken from the prostate and checked under a microscope (prostate biopsy). An imaging test called transrectal ultrasonography. Once the condition is diagnosed, tests will be done to determine how far the cancer has spread. This is called staging the cancer. Staging may involve imaging tests, such as a bone  scan, CT scan, PET scan, or MRI. Stages of prostate cancer The stages of prostate cancer are as follows: Stage 1 (I). At this stage, the cancer is found in the prostate only. The cancer is not visible on imaging tests, and it is usually found by accident, such as during prostate surgery. Stage 2 (II). At this stage, the cancer is more advanced than it is in stage 1, but the cancer has not spread outside the prostate. Stage 3 (III). At this stage, the cancer has spread beyond the outer layer of the prostate to nearby tissues. The cancer may be found in the seminal vesicles, which are near the bladder and the prostate. Stage 4 (IV). At this stage, the cancer has spread to other parts of the body, such as the lymph nodes, bones, bladder, rectum, liver, or lungs. Prostate cancer grading Prostate cancer is also graded according to how the cancer cells look under a microscope. This is called the Gleason score and the total score can range from 6-10, indicating how likely it is that the cancer will spread (metastasize) to other parts of the body. The higher the score, the greater the likelihood that the cancer will spread. Gleason 6 or lower: This indicates that the cancer cells look similar to normal prostate cells (well differentiated). Gleason 7: This indicates that the cancer cells look somewhat similar to normal prostate cells (moderately differentiated). Gleason 8, 9, or 10: This indicates that the cancer cells look very different than normal prostate cells (poorly differentiated). How is this treated? Treatment for this condition depends on several   factors, including the stage of the cancer, your age, personal preferences, and your overall health. Talk with your health care provider about treatment options that are recommended for you. Common treatments include: Observation for early stage prostate cancer (active surveillance). This involves having exams, blood tests, and in some cases, more biopsies.  For some men, this is the only treatment needed. Surgery. Types of surgeries include: Open surgery (radical prostatectomy). In this surgery, a larger incision is made to remove the prostate. A laparoscopic radical prostatectomy. This is a surgery to remove the prostate and lymph nodes through several small incisions. It is often referred to as a minimally invasive surgery. A robotic radical prostatectomy. This is laparoscopic surgery to remove the prostate and lymph nodes with the help of robotic arms that are controlled by the surgeon. Cryoablation. This is surgery to freeze and destroy cancer cells. Radiation treatment. Types of radiation treatment include: External beam radiation. This type aims beams of radiation from outside the body at the prostate to destroy cancerous cells. Brachytherapy. This type uses radioactive needles, seeds, wires, or tubes that are implanted into the prostate gland. Like external beam radiation, brachytherapy destroys cancerous cells. An advantage is that this type of radiation limits the damage to surrounding tissue and has fewer side effects. Chemotherapy. This treatment kills cancer cells or stops them from multiplying. It kills both cancer cells and normal cells. Targeted therapy. This treatment uses medicines to kill cancer cells without damaging normal cells. Hormone treatment. This treatment involves taking medicines that act on testosterone, one of the male hormones, by: Stopping your body from producing testosterone. Blocking testosterone from reaching cancer cells. Follow these instructions at home: Lifestyle Do not use any products that contain nicotine or tobacco. These products include cigarettes, chewing tobacco, and vaping devices, such as e-cigarettes. If you need help quitting, ask your health care provider. Eat a healthy diet. To do this: Eat foods that are high in fiber. These include beans, whole grains, and fresh fruits and vegetables. Limit  foods that are high in fat and sugar. These include fried or sweet foods. Treatment for prostate cancer may affect sexual function. If you have a partner, continue to have intimate moments. This may include touching, holding, hugging, and caressing your partner. Get plenty of sleep. Consider joining a support group for men who have prostate cancer. Meeting with a support group may help you learn to manage the stress of having cancer. General instructions Take over-the-counter and prescription medicines only as told by your health care provider. If you have to go to the hospital, notify your cancer specialist (oncologist). Keep all follow-up visits. This is important. Where to find more information American Cancer Society: www.cancer.org American Society of Clinical Oncology: www.cancer.net National Cancer Institute: www.cancer.gov Contact a health care provider if: You have new or increasing trouble urinating. You have new or increasing blood in your urine. You have new or increasing pain in your hips, back, or chest. Get help right away if: You have weakness or numbness in your legs. You cannot control urination or your bowel movements (incontinence). You have chills or a fever. Summary The prostate is a small gland that is involved in the production of semen. It is located below a man's bladder, in front of the rectum. Prostate cancer is the abnormal growth of cells in the prostate gland. Treatment for this condition depends on the stage of the cancer, your age, personal preferences, and your overall health. Talk with your health care provider about   treatment options that are recommended for you. Consider joining a support group for men who have prostate cancer. Meeting with a support group may help you learn to manage the stress of having cancer. This information is not intended to replace advice given to you by your health care provider. Make sure you discuss any questions you have with  your health care provider. Document Revised: 04/19/2020 Document Reviewed: 04/19/2020 Elsevier Patient Education  2023 Elsevier Inc.  

## 2022-05-31 ENCOUNTER — Other Ambulatory Visit: Payer: Self-pay | Admitting: Family Medicine

## 2022-06-17 ENCOUNTER — Other Ambulatory Visit: Payer: BC Managed Care – PPO

## 2022-06-28 ENCOUNTER — Ambulatory Visit: Payer: BC Managed Care – PPO | Admitting: Urology

## 2022-10-29 ENCOUNTER — Encounter: Payer: Self-pay | Admitting: Urology

## 2022-11-25 ENCOUNTER — Other Ambulatory Visit: Payer: 59

## 2022-11-25 ENCOUNTER — Ambulatory Visit (INDEPENDENT_AMBULATORY_CARE_PROVIDER_SITE_OTHER): Payer: 59 | Admitting: Family Medicine

## 2022-11-25 ENCOUNTER — Encounter: Payer: Self-pay | Admitting: Family Medicine

## 2022-11-25 VITALS — BP 122/72 | HR 72 | Temp 98.3°F | Ht 70.5 in | Wt 201.8 lb

## 2022-11-25 DIAGNOSIS — I10 Essential (primary) hypertension: Secondary | ICD-10-CM | POA: Diagnosis not present

## 2022-11-25 DIAGNOSIS — C61 Malignant neoplasm of prostate: Secondary | ICD-10-CM

## 2022-11-25 DIAGNOSIS — E559 Vitamin D deficiency, unspecified: Secondary | ICD-10-CM | POA: Diagnosis not present

## 2022-11-25 DIAGNOSIS — E782 Mixed hyperlipidemia: Secondary | ICD-10-CM | POA: Diagnosis not present

## 2022-11-25 DIAGNOSIS — J300 Vasomotor rhinitis: Secondary | ICD-10-CM

## 2022-11-25 MED ORDER — FLUTICASONE PROPIONATE 50 MCG/ACT NA SUSP: 2.0000 | Freq: Two times a day (BID) | NASAL | 11 refills | Status: DC

## 2022-11-25 MED ORDER — METOPROLOL SUCCINATE ER 50 MG PO TB24: 50.0000 mg | ORAL_TABLET | Freq: Every day | ORAL | 1 refills | Status: DC

## 2022-11-25 NOTE — Progress Notes (Signed)
presents for  follow-up of hypertension. Patient has no history of headache chest pain or shortness of breath or recent cough. Patient also denies symptoms of TIA such as focal numbness or weakness. Patient denies side effects from medication. States taking it regularly.   Subjective:  Patient ID: Edward Taylor, male    DOB: September 29, 1955  Age: 67 y.o. MRN: [id number]  CC: Medical Management of Chronic Issues   HPI [name] presents for  presents for  follow-up of hypertension. Patient has no history of headache chest pain or shortness of breath or recent cough. Patient also denies symptoms of TIA such as focal numbness or weakness. Patient denies side effects from medication. States taking it regularly. Getting orthostatic. Systolic running 756-433 systolic. 65 diastolic.    in for follow-up of elevated cholesterol. Doing well without complaints on current medication. Denies side effects of statin including myalgia and arthralgia and nausea. Currently no chest pain, shortness of breath or other cardiovascular related symptoms noted.  Walking 6-10 miles a day, 5 days a week, for 4 months. Now down 17 lbs. Eating 2 meals, no snacks daily except adds breakfast on the weekend.       11/25/2022    7:57 AM 11/25/2022    7:49 AM 05/22/2022    8:10 AM  Depression screen PHQ 2/9  Decreased Interest 1 0 2  Down, Depressed, Hopeless 0 0 0  PHQ - 2 Score 1 0 2  Altered sleeping 0  0  Tired, decreased energy 2  2  Change in appetite 0  0  Feeling bad or failure about yourself  0  0  Trouble concentrating 1  0  Moving slowly or fidgety/restless 0  0  Suicidal thoughts 0  0  PHQ-9 Score 4  4  Difficult doing work/chores Not difficult at all  Not difficult at all     History Edward Taylor has a past medical history of Allergy, Basal cell carcinoma, GERD (gastroesophageal reflux disease), Hyperlipidemia, Hypertension, and Prostate cancer (HCC).   He has a past surgical history that includes  Colonoscopy; Gold seed implant (N/A, 05/24/2021); and SPACE OAR INSTILLATION (N/A, 05/24/2021).   His family history includes Arthritis in his father; Cancer in his mother; Diabetes in his father; Heart disease in his maternal grandfather and paternal grandfather; Hyperlipidemia in his father; Hypertension in his father and mother; Stroke in his father.He reports that he has never smoked. He has never used smokeless tobacco. He reports current alcohol use of about 4.0 standard drinks of alcohol per week. He reports that he does not use drugs.    ROS Review of Systems  Constitutional:  Negative for fever.  Respiratory:  Negative for shortness of breath.   Cardiovascular:  Negative for chest pain.  Musculoskeletal:  Negative for arthralgias.  Skin:  Negative for rash.    Objective:  BP 122/72   Pulse 72   Temp 98.3 F (36.8 C)   Ht 5' 10.5" (1.791 m)   Wt 201 lb 12.8 oz (91.5 kg)   SpO2 99%   BMI 28.55 kg/m   BP Readings from Last 3 Encounters:  11/25/22 122/72  05/27/22 (!) 145/83  05/22/22 123/68    Wt Readings from Last 3 Encounters:  11/25/22 201 lb 12.8 oz (91.5 kg)  05/22/22 218 lb 12.8 oz (99.2 kg)  11/21/21 213 lb 12.8 oz (97 kg)     Physical Exam Vitals reviewed.  Constitutional:      Appearance: He is well-developed.  HENT:  Head: Normocephalic and atraumatic.     Right Ear: External ear normal.     Left Ear: External ear normal.     Mouth/Throat:     Pharynx: No oropharyngeal exudate or posterior oropharyngeal erythema.  Eyes:     Pupils: Pupils are equal, round, and reactive to light.  Cardiovascular:     Rate and Rhythm: Normal rate and regular rhythm.     Heart sounds: No murmur heard. Pulmonary:     Effort: No respiratory distress.     Breath sounds: Normal breath sounds.  Musculoskeletal:     Cervical back: Normal range of motion and neck supple.  Neurological:     Mental Status: He is alert and oriented to person, place, and time.        Assessment & Plan:   [name]" was seen today for medical management of chronic issues.  Diagnoses and all orders for this visit:  Essential hypertension -     CBC with Differential/Platelet -     CMP14+EGFR -     metoprolol succinate (TOPROL-XL) 50 MG 24 hr tablet; Take 1 tablet (50 mg total) by mouth daily. Take with or immediately following a meal.  Mixed hyperlipidemia -     Lipid panel  Vitamin D deficiency -     VITAMIN D 25 Hydroxy (Vit-D Deficiency, Fractures)  Malignant neoplasm of prostate (HCC) -     PSA, total and free  Vasomotor rhinitis  Other orders -     fluticasone (FLONASE) 50 MCG/ACT nasal spray; Place 2 sprays into both nostrils 2 (two) times daily.       I have discontinued [name] "Mark"'s Semaglutide-Weight Management. I have also changed his metoprolol succinate and fluticasone. Additionally, I am having him maintain his cetirizine, zinc gluconate, fexofenadine-pseudoephedrine, Vitamin D, Turmeric, sildenafil, rosuvastatin, lisinopril, and famotidine.  Allergies as of 11/25/2022   No Known Allergies      Medication List        Accurate as of November 25, 2022  8:27 AM. If you have any questions, ask your nurse or doctor.          STOP taking these medications    Semaglutide-Weight Management 1 MG/0.5ML Soaj Stopped by: [name]       TAKE these medications    cetirizine 10 MG tablet Commonly known as: ZYRTEC Take 10 mg by mouth daily.   famotidine 40 MG tablet Commonly known as: PEPCID TAKE 1 TABLET DAILY   fexofenadine-pseudoephedrine 60-120 MG 12 hr tablet Commonly known as: ALLEGRA-D Take 1 tablet by mouth daily as needed (allergies/congestion).   fluticasone 50 MCG/ACT nasal spray Commonly known as: FLONASE Place 2 sprays into both nostrils 2 (two) times daily. What changed:  how much to take when to take this Changed by: [name]   lisinopril 20 MG tablet Commonly known as:  ZESTRIL Take 1 tablet (20 mg total) by mouth daily.   metoprolol succinate 50 MG 24 hr tablet Commonly known as: TOPROL-XL Take 1 tablet (50 mg total) by mouth daily. Take with or immediately following a meal. What changed:  medication strength how much to take Changed by: [name]   rosuvastatin 20 MG tablet Commonly known as: Crestor Take 1 tablet (20 mg total) by mouth daily. for cholesterol   sildenafil 20 MG tablet Commonly known as: REVATIO TAKE 2-5 PILLS AT ONCE BY MOUTH WITH EACH SEXUAL ENCOUNTER   Turmeric 500 MG Caps Take 500 mg by mouth daily.   Vitamin D  125 MCG (5000 UT) Caps Take 5,000 Units by mouth daily.   zinc gluconate 50 MG tablet Take 50 mg by mouth daily.         Follow-up: Return in about 6 months (around 05/26/2023).  Mechele Claude, M.D.

## 2022-11-26 ENCOUNTER — Telehealth: Payer: Self-pay | Admitting: Family Medicine

## 2022-11-26 LAB — CMP14+EGFR
ALT: 32 IU/L (ref 0–44)
AST: 29 [IU]/L (ref 0–40)
Albumin: 4.9 g/dL (ref 3.9–4.9)
Alkaline Phosphatase: 68 [IU]/L (ref 44–121)
BUN/Creatinine Ratio: 18 (ref 10–24)
BUN: 21 mg/dL (ref 8–27)
Bilirubin Total: 0.5 mg/dL (ref 0.0–1.2)
CO2: 22 mmol/L (ref 20–29)
Calcium: 10 mg/dL (ref 8.6–10.2)
Chloride: 100 mmol/L (ref 96–106)
Creatinine, Ser: 1.15 mg/dL (ref 0.76–1.27)
Globulin, Total: 2.8 g/dL (ref 1.5–4.5)
Glucose: 112 mg/dL — ABNORMAL HIGH (ref 70–99)
Potassium: 4.7 mmol/L (ref 3.5–5.2)
Sodium: 138 mmol/L (ref 134–144)
Total Protein: 7.7 g/dL (ref 6.0–8.5)
eGFR: 71 mL/min/{1.73_m2} (ref >59)

## 2022-11-26 LAB — CBC WITH DIFFERENTIAL/PLATELET
Basophils Absolute: 0.1 10*3/uL (ref 0.0–0.2)
Basos: 1 %
EOS (ABSOLUTE): 0.2 10*3/uL (ref 0.0–0.4)
Eos: 2 %
Hematocrit: 45.1 % (ref 37.5–51.0)
Hemoglobin: 14.6 g/dL (ref 13.0–17.7)
Immature Grans (Abs): 0.1 10*3/uL (ref 0.0–0.1)
Immature Granulocytes: 1 %
Lymphocytes Absolute: 1.2 10*3/uL (ref 0.7–3.1)
Lymphs: 18 %
MCH: 30.2 pg (ref 26.6–33.0)
MCHC: 32.4 g/dL (ref 31.5–35.7)
MCV: 93 fL (ref 79–97)
Monocytes Absolute: 0.6 10*3/uL (ref 0.1–0.9)
Monocytes: 8 %
Neutrophils Absolute: 4.6 10*3/uL (ref 1.4–7.0)
Neutrophils: 70 %
Platelets: 194 10*3/uL (ref 150–450)
RBC: 4.83 x10E6/uL (ref 4.14–5.80)
RDW: 12.6 % (ref 11.6–15.4)
WBC: 6.6 10*3/uL (ref 3.4–10.8)

## 2022-11-26 LAB — LIPID PANEL
Chol/HDL Ratio: 2.7 ratio (ref 0.0–5.0)
Cholesterol, Total: 137 mg/dL (ref 100–199)
HDL: 50 mg/dL (ref >39)
LDL Chol Calc (NIH): 61 mg/dL (ref 0–99)
Triglycerides: 150 mg/dL — ABNORMAL HIGH (ref 0–149)
VLDL Cholesterol Cal: 26 mg/dL (ref 5–40)

## 2022-11-26 LAB — PSA, TOTAL AND FREE
PSA, Free: <0.02 ng/mL
Prostate Specific Ag, Serum: <0.1 ng/mL (ref 0.0–4.0)

## 2022-11-26 LAB — VITAMIN D 25 HYDROXY (VIT D DEFICIENCY, FRACTURES): Vit D, 25-Hydroxy: 99.7 ng/mL (ref 30.0–100.0)

## 2022-11-26 NOTE — Progress Notes (Signed)
Hello Takeem,  Your lab result is normal and/or stable.Some minor variations that are not significant are commonly marked abnormal, but do not represent any medical problem for you.  Best regards, Kenidee Cregan, M.D.

## 2022-11-26 NOTE — Telephone Encounter (Signed)
Forwarded to Dr. Tresea Mall per pt. request

## 2022-12-02 ENCOUNTER — Ambulatory Visit (INDEPENDENT_AMBULATORY_CARE_PROVIDER_SITE_OTHER): Payer: 59 | Admitting: Urology

## 2022-12-02 ENCOUNTER — Encounter: Payer: Self-pay | Admitting: Urology

## 2022-12-02 VITALS — BP 120/72 | HR 69

## 2022-12-02 DIAGNOSIS — C61 Malignant neoplasm of prostate: Secondary | ICD-10-CM | POA: Diagnosis not present

## 2022-12-02 DIAGNOSIS — N5201 Erectile dysfunction due to arterial insufficiency: Secondary | ICD-10-CM | POA: Diagnosis not present

## 2022-12-02 LAB — URINALYSIS, ROUTINE W REFLEX MICROSCOPIC
Bilirubin, UA: NEGATIVE
Glucose, UA: NEGATIVE
Ketones, UA: NEGATIVE
Leukocytes,UA: NEGATIVE
Nitrite, UA: NEGATIVE
Protein,UA: NEGATIVE
RBC, UA: NEGATIVE
Specific Gravity, UA: 1.03 (ref 1.005–1.030)
Urobilinogen, Ur: 0.2 mg/dL (ref 0.2–1.0)
pH, UA: 5.5 (ref 5.0–7.5)

## 2022-12-02 NOTE — Progress Notes (Signed)
12/02/2022 8:22 AM   [name] 1955/10/06 [id number]  Referring provider: Mechele Claude, MD [address] [place],  [place] [address]  Followup prostate cancer and erectile dysfunction   HPI: Edward Taylor is a 67yo here for followup for prostate cancer and erectile dysfunction. PSA undetectable off ADT. He has good energy. He uses sildenafil prn with good results. He gets nasal congestion with sildenafil   PMH: Past Medical History:  Diagnosis Date   Allergy    Basal cell carcinoma    GERD (gastroesophageal reflux disease)    Hyperlipidemia    Hypertension    Prostate cancer [facility])     Surgical History: Past Surgical History:  Procedure Laterality Date   COLONOSCOPY     GOLD SEED IMPLANT N/A 05/24/2021   Procedure: GOLD SEED IMPLANT;  Surgeon: Malen Gauze, MD;  Location: [facility];  Service: Urology;  Laterality: N/A;   SPACE OAR INSTILLATION N/A 05/24/2021   Procedure: SPACE OAR INSTILLATION;  Surgeon: Malen Gauze, MD;  Location: [facility];  Service: Urology;  Laterality: N/A;    Home Medications:  Allergies as of 12/02/2022   No Known Allergies      Medication List        Accurate as of December 02, 2022  8:22 AM. If you have any questions, ask your nurse or doctor.          cetirizine 10 MG tablet Commonly known as: ZYRTEC Take 10 mg by mouth daily.   famotidine 40 MG tablet Commonly known as: PEPCID TAKE 1 TABLET DAILY   fexofenadine-pseudoephedrine 60-120 MG 12 hr tablet Commonly known as: ALLEGRA-D Take 1 tablet by mouth daily as needed (allergies/congestion).   fluticasone 50 MCG/ACT nasal spray Commonly known as: FLONASE Place 2 sprays into both nostrils 2 (two) times daily.   lisinopril 20 MG tablet Commonly known as: ZESTRIL Take 1 tablet (20 mg total) by mouth daily.   metoprolol succinate 50 MG 24 hr tablet Commonly known as: TOPROL-XL Take 1 tablet (50 mg total) by mouth daily. Take with or immediately following a  meal.   rosuvastatin 20 MG tablet Commonly known as: Crestor Take 1 tablet (20 mg total) by mouth daily. for cholesterol   sildenafil 20 MG tablet Commonly known as: REVATIO TAKE 2-5 PILLS AT ONCE BY MOUTH WITH EACH SEXUAL ENCOUNTER   Turmeric 500 MG Caps Take 500 mg by mouth daily.   Vitamin D 125 MCG (5000 UT) Caps Take 5,000 Units by mouth daily.   zinc gluconate 50 MG tablet Take 50 mg by mouth daily.        Allergies: No Known Allergies  Family History: Family History  Problem Relation Age of Onset   Cancer Mother    Hypertension Mother    Arthritis Father    Diabetes Father    Hyperlipidemia Father    Hypertension Father    Stroke Father    Heart disease Maternal Grandfather    Heart disease Paternal Grandfather     Social History:  reports that he has never smoked. He has never used smokeless tobacco. He reports current alcohol use of about 4.0 standard drinks of alcohol per week. He reports that he does not use drugs.  ROS: All other review of systems were reviewed and are negative except what is noted above in HPI  Physical Exam: BP 120/72   Pulse 69   Constitutional:  Alert and oriented, No acute distress. HEENT: [place] AT, moist mucus membranes.  Trachea midline,  no masses. Cardiovascular: No clubbing, cyanosis, or edema. Respiratory: Normal respiratory effort, no increased work of breathing. GI: Abdomen is soft, nontender, nondistended, no abdominal masses GU: No CVA tenderness.  Lymph: No cervical or inguinal lymphadenopathy. Skin: No rashes, bruises or suspicious lesions. Neurologic: Grossly intact, no focal deficits, moving all 4 extremities. Psychiatric: Normal mood and affect.  Laboratory Data: Lab Results  Component Value Date   WBC 6.6 11/25/2022   HGB 14.6 11/25/2022   HCT 45.1 11/25/2022   MCV 93 11/25/2022   PLT 194 11/25/2022    Lab Results  Component Value Date   CREATININE 1.15 11/25/2022    No results found for:  "PSA"  Lab Results  Component Value Date   TESTOSTERONE 128 (L) 11/21/2021    No results found for: "HGBA1C"  Urinalysis    Component Value Date/Time   APPEARANCEUR Clear 03/08/2022 0842   GLUCOSEU Negative 03/08/2022 0842   BILIRUBINUR Negative 03/08/2022 0842   PROTEINUR Trace 03/08/2022 0842   NITRITE Negative 03/08/2022 0842   LEUKOCYTESUR Negative 03/08/2022 0842    Lab Results  Component Value Date   LABMICR Comment 03/08/2022   WBCUA None seen 04/04/2021   LABEPIT None seen 04/04/2021   MUCUS Present 04/04/2021   BACTERIA None seen 04/04/2021    Pertinent Imaging:  No results found for this or any previous visit.  No results found for this or any previous visit.  No results found for this or any previous visit.  No results found for this or any previous visit.  No results found for this or any previous visit.  No valid procedures specified. No results found for this or any previous visit.  No results found for this or any previous visit.   Assessment & Plan:    1. Prostate cancer (HCC) -followup 6 months with PSA - Urinalysis, Routine w reflex microscopic  2. Erectile dysfunction due to arterial insufficiency -continue sildenafil prn   No follow-ups on file.  [name], MD  [facility] [place]

## 2022-12-02 NOTE — Patient Instructions (Signed)

## 2023-01-14 ENCOUNTER — Other Ambulatory Visit: Payer: Self-pay | Admitting: Family Medicine

## 2023-02-07 ENCOUNTER — Encounter: Payer: Self-pay | Admitting: Urology

## 2023-02-07 NOTE — Telephone Encounter (Signed)
 See below

## 2023-03-10 NOTE — Telephone Encounter (Signed)
Hey is there an update for the patient regarding his bill?

## 2023-04-08 DIAGNOSIS — K080 Exfoliation of teeth due to systemic causes: Secondary | ICD-10-CM | POA: Diagnosis not present

## 2023-05-19 DIAGNOSIS — L570 Actinic keratosis: Secondary | ICD-10-CM | POA: Diagnosis not present

## 2023-05-19 DIAGNOSIS — L821 Other seborrheic keratosis: Secondary | ICD-10-CM | POA: Diagnosis not present

## 2023-05-27 ENCOUNTER — Ambulatory Visit: Payer: 59 | Admitting: Family Medicine

## 2023-05-27 ENCOUNTER — Encounter: Payer: Self-pay | Admitting: Family Medicine

## 2023-05-27 ENCOUNTER — Other Ambulatory Visit: Payer: 59

## 2023-05-27 VITALS — BP 114/75 | HR 67 | Temp 97.9°F | Ht 70.5 in | Wt 200.0 lb

## 2023-05-27 DIAGNOSIS — E559 Vitamin D deficiency, unspecified: Secondary | ICD-10-CM

## 2023-05-27 DIAGNOSIS — Z0000 Encounter for general adult medical examination without abnormal findings: Secondary | ICD-10-CM | POA: Diagnosis not present

## 2023-05-27 DIAGNOSIS — C61 Malignant neoplasm of prostate: Secondary | ICD-10-CM

## 2023-05-27 DIAGNOSIS — I10 Essential (primary) hypertension: Secondary | ICD-10-CM

## 2023-05-27 DIAGNOSIS — E782 Mixed hyperlipidemia: Secondary | ICD-10-CM

## 2023-05-27 DIAGNOSIS — Z0001 Encounter for general adult medical examination with abnormal findings: Secondary | ICD-10-CM | POA: Diagnosis not present

## 2023-05-27 LAB — URINALYSIS
Bilirubin, UA: NEGATIVE
Glucose, UA: NEGATIVE
Ketones, UA: NEGATIVE
Leukocytes,UA: NEGATIVE
Nitrite, UA: NEGATIVE
Protein,UA: NEGATIVE
RBC, UA: NEGATIVE
Specific Gravity, UA: 1.02 (ref 1.005–1.030)
Urobilinogen, Ur: 0.2 mg/dL (ref 0.2–1.0)
pH, UA: 5 (ref 5.0–7.5)

## 2023-05-27 MED ORDER — LISINOPRIL 20 MG PO TABS: 20.0000 mg | ORAL_TABLET | Freq: Every day | ORAL | 3 refills | Status: AC

## 2023-05-27 MED ORDER — METOPROLOL SUCCINATE ER 50 MG PO TB24: 50.0000 mg | ORAL_TABLET | Freq: Every day | ORAL | 3 refills | Status: AC

## 2023-05-27 MED ORDER — ROSUVASTATIN CALCIUM 20 MG PO TABS: 20.0000 mg | ORAL_TABLET | Freq: Every day | ORAL | 3 refills | Status: AC

## 2023-05-27 MED ORDER — ACETAMINOPHEN 500 MG PO TABS
1000.0000 mg | ORAL_TABLET | Freq: Three times a day (TID) | ORAL | 99 refills | Status: AC
Start: 2023-05-27 — End: ?

## 2023-05-27 NOTE — Progress Notes (Signed)
 Subjective:  Patient ID: Edward Taylor, male    DOB: 08-25-1955  Age: 68 y.o. MRN: [id number]  CC: Annual Exam   HPI Edward Taylor presents for complete physical examination.   Walking 6-7 miles daily.      05/27/2023    7:58 AM 11/25/2022    7:57 AM 11/25/2022    7:49 AM  Depression screen PHQ 2/9  Decreased Interest 1 1 0  Down, Depressed, Hopeless 1 0 0  PHQ - 2 Score 2 1 0  Altered sleeping 0 0   Tired, decreased energy 0 2   Change in appetite 0 0   Feeling bad or failure about yourself  0 0   Trouble concentrating 0 1   Moving slowly or fidgety/restless 0 0   Suicidal thoughts 0 0   PHQ-9 Score 2 4   Difficult doing work/chores Not difficult at all Not difficult at all     History Edward Taylor has a past medical history of Allergy, Basal cell carcinoma, GERD (gastroesophageal reflux disease), Hyperlipidemia, Hypertension, and Prostate cancer (HCC).   He has a past surgical history that includes Colonoscopy; Gold seed implant (N/A, 05/24/2021); and SPACE OAR INSTILLATION (N/A, 05/24/2021).   His family history includes Arthritis in his father; Cancer in his mother; Diabetes in his father; Heart disease in his maternal grandfather and paternal grandfather; Hyperlipidemia in his father; Hypertension in his father and mother; Stroke in his father.He reports that he has never smoked. He has never used smokeless tobacco. He reports current alcohol use of about 4.0 standard drinks of alcohol per week. He reports that he does not use drugs.    ROS Review of Systems  Constitutional:  Negative for activity change, fatigue and unexpected weight change.  HENT:  Negative for congestion, ear pain, hearing loss, postnasal drip and trouble swallowing.   Eyes:  Negative for pain and visual disturbance.  Respiratory:  Negative for cough, chest tightness and shortness of breath.   Cardiovascular:  Negative for chest pain, palpitations and leg swelling.  Gastrointestinal:  Negative for  abdominal distention, abdominal pain, blood in stool, constipation, diarrhea, nausea and vomiting.  Endocrine: Negative for cold intolerance, heat intolerance and polydipsia.  Genitourinary:  Negative for difficulty urinating, dysuria, flank pain, frequency and urgency.  Musculoskeletal:  Positive for back pain (getting worse. On AM arising has stiffness and pain for 10 minutes.). Negative for arthralgias and joint swelling.  Skin:  Negative for color change, rash and wound.  Neurological:  Negative for dizziness, syncope, speech difficulty, weakness, light-headedness, numbness and headaches.  Hematological:  Does not bruise/bleed easily.  Psychiatric/Behavioral:  Negative for confusion, decreased concentration, dysphoric mood and sleep disturbance. The patient is not nervous/anxious.     Objective:  BP 114/75   Pulse 67   Temp 97.9 F (36.6 C)   Ht 5' 10.5" (1.791 m)   Wt 200 lb (90.7 kg)   SpO2 97%   BMI 28.29 kg/m   BP Readings from Last 3 Encounters:  05/27/23 114/75  12/02/22 120/72  11/25/22 122/72    Wt Readings from Last 3 Encounters:  05/27/23 200 lb (90.7 kg)  11/25/22 201 lb 12.8 oz (91.5 kg)  05/22/22 218 lb 12.8 oz (99.2 kg)     Physical Exam Constitutional:      Appearance: He is well-developed.  HENT:     Head: Normocephalic and atraumatic.  Eyes:     Pupils: Pupils are equal, round, and reactive to light.  Neck:  Thyroid: No thyromegaly.     Trachea: No tracheal deviation.  Cardiovascular:     Rate and Rhythm: Normal rate and regular rhythm.     Heart sounds: Normal heart sounds. No murmur heard.    No friction rub. No gallop.  Pulmonary:     Breath sounds: Normal breath sounds. No wheezing or rales.  Abdominal:     General: Bowel sounds are normal. There is no distension.     Palpations: Abdomen is soft. There is no mass.     Tenderness: There is no abdominal tenderness.     Hernia: There is no hernia in the left inguinal area.   Genitourinary:    Penis: Normal.      Testes: Normal.  Musculoskeletal:        General: Normal range of motion.     Cervical back: Normal range of motion.  Lymphadenopathy:     Cervical: No cervical adenopathy.  Skin:    General: Skin is warm and dry.  Neurological:     Mental Status: He is alert and oriented to person, place, and time.      Assessment & Plan:  Well adult exam -     CBC with Differential/Platelet -     CMP14+EGFR -     Urinalysis  Essential hypertension -     Lisinopril ; Take 1 tablet (20 mg total) by mouth daily.  Dispense: 90 tablet; Refill: 3 -     Metoprolol  Succinate ER; Take 1 tablet (50 mg total) by mouth daily. Take with or immediately following a meal.  Dispense: 90 tablet; Refill: 3 -     CBC with Differential/Platelet -     CMP14+EGFR -     Urinalysis  Mixed hyperlipidemia -     CBC with Differential/Platelet -     CMP14+EGFR -     Lipid panel  Malignant neoplasm of prostate (HCC) -     CBC with Differential/Platelet -     CMP14+EGFR -     PSA, total and free  Vitamin D  deficiency -     VITAMIN D  25 Hydroxy (Vit-D Deficiency, Fractures)  Other orders -     Rosuvastatin  Calcium ; Take 1 tablet (20 mg total) by mouth daily. for cholesterol  Dispense: 90 tablet; Refill: 3 -     Acetaminophen ; Take 2 tablets (1,000 mg total) by mouth 3 (three) times daily.  Dispense: 180 tablet; Refill: PRN     Follow-up: Return in about 6 months (around 11/26/2023).  Roise Cleaver, M.D.

## 2023-05-27 NOTE — Patient Instructions (Signed)

## 2023-05-28 LAB — CMP14+EGFR
ALT: 25 IU/L (ref 0–44)
AST: 22 IU/L (ref 0–40)
Albumin: 5.1 g/dL — ABNORMAL HIGH (ref 3.9–4.9)
Alkaline Phosphatase: 61 IU/L (ref 44–121)
BUN/Creatinine Ratio: 20 (ref 10–24)
BUN: 19 mg/dL (ref 8–27)
Bilirubin Total: 0.6 mg/dL (ref 0.0–1.2)
CO2: 20 mmol/L (ref 20–29)
Calcium: 9.9 mg/dL (ref 8.6–10.2)
Chloride: 102 mmol/L (ref 96–106)
Creatinine, Ser: 0.97 mg/dL (ref 0.76–1.27)
Globulin, Total: 2.5 g/dL (ref 1.5–4.5)
Glucose: 110 mg/dL — ABNORMAL HIGH (ref 70–99)
Potassium: 4.5 mmol/L (ref 3.5–5.2)
Sodium: 139 mmol/L (ref 134–144)
Total Protein: 7.6 g/dL (ref 6.0–8.5)
eGFR: 87 mL/min/{1.73_m2} (ref >59)

## 2023-05-28 LAB — CBC WITH DIFFERENTIAL/PLATELET
Basophils Absolute: 0.1 10*3/uL (ref 0.0–0.2)
Basos: 1 %
EOS (ABSOLUTE): 0.2 10*3/uL (ref 0.0–0.4)
Eos: 3 %
Hematocrit: 46.1 % (ref 37.5–51.0)
Hemoglobin: 15.6 g/dL (ref 13.0–17.7)
Immature Grans (Abs): 0.1 10*3/uL (ref 0.0–0.1)
Immature Granulocytes: 1 %
Lymphocytes Absolute: 1.2 10*3/uL (ref 0.7–3.1)
Lymphs: 19 %
MCH: 29.8 pg (ref 26.6–33.0)
MCHC: 33.8 g/dL (ref 31.5–35.7)
MCV: 88 fL (ref 79–97)
Monocytes Absolute: 0.4 10*3/uL (ref 0.1–0.9)
Monocytes: 7 %
Neutrophils Absolute: 4.4 10*3/uL (ref 1.4–7.0)
Neutrophils: 69 %
Platelets: 200 10*3/uL (ref 150–450)
RBC: 5.23 x10E6/uL (ref 4.14–5.80)
RDW: 13 % (ref 11.6–15.4)
WBC: 6.4 10*3/uL (ref 3.4–10.8)

## 2023-05-28 LAB — PSA, TOTAL AND FREE

## 2023-05-28 LAB — LIPID PANEL
Chol/HDL Ratio: 2.6 ratio (ref 0.0–5.0)
Cholesterol, Total: 138 mg/dL (ref 100–199)
HDL: 53 mg/dL (ref >39)
LDL Chol Calc (NIH): 69 mg/dL (ref 0–99)
Triglycerides: 86 mg/dL (ref 0–149)
VLDL Cholesterol Cal: 16 mg/dL (ref 5–40)

## 2023-05-28 LAB — VITAMIN D 25 HYDROXY (VIT D DEFICIENCY, FRACTURES): Vit D, 25-Hydroxy: 62.8 ng/mL (ref 30.0–100.0)

## 2023-06-02 ENCOUNTER — Encounter: Payer: Self-pay | Admitting: Urology

## 2023-06-02 ENCOUNTER — Encounter: Payer: Self-pay | Admitting: Family Medicine

## 2023-06-02 ENCOUNTER — Ambulatory Visit: Payer: 59 | Admitting: Urology

## 2023-06-02 VITALS — BP 128/72 | HR 71

## 2023-06-02 DIAGNOSIS — M79673 Pain in unspecified foot: Secondary | ICD-10-CM

## 2023-06-02 DIAGNOSIS — N5201 Erectile dysfunction due to arterial insufficiency: Secondary | ICD-10-CM | POA: Diagnosis not present

## 2023-06-02 DIAGNOSIS — C61 Malignant neoplasm of prostate: Secondary | ICD-10-CM | POA: Diagnosis not present

## 2023-06-02 LAB — URINALYSIS, ROUTINE W REFLEX MICROSCOPIC
Bilirubin, UA: NEGATIVE
Glucose, UA: NEGATIVE
Ketones, UA: NEGATIVE
Leukocytes,UA: NEGATIVE
Nitrite, UA: NEGATIVE
Protein,UA: NEGATIVE
RBC, UA: NEGATIVE
Specific Gravity, UA: 1.025 (ref 1.005–1.030)
Urobilinogen, Ur: 0.2 mg/dL (ref 0.2–1.0)
pH, UA: 6 (ref 5.0–7.5)

## 2023-06-02 MED ORDER — TADALAFIL 20 MG PO TABS: 20.0000 mg | ORAL_TABLET | Freq: Every day | ORAL | 5 refills | Status: DC | PRN

## 2023-06-02 NOTE — Progress Notes (Signed)
 Hello Freeland,  Your lab result is normal and/or stable.Some minor variations that are not significant are commonly marked abnormal, but do not represent any medical problem for you.  Best regards, Mechele Claude, M.D.

## 2023-06-02 NOTE — Patient Instructions (Signed)

## 2023-06-02 NOTE — Progress Notes (Signed)
 06/02/2023 10:34 AM   Edward Taylor August 14, 1955 [id number]  Referring provider: Roise Cleaver, MD [address] [place],  [place] [address]  Followup prostate cancer   HPI: Edward Taylor is a 67yo here for followup for prostate cancer and erectile dysfunction. PSA remains undetectable off of ADT. His energy has improved. No hot flashes. IPSS 0 QOL 0 on no BPH therapy. He uses sildenafil  occasionally with mixed results. He gets nocturnal erections.    PMH: Past Medical History:  Diagnosis Date   Allergy    Basal cell carcinoma    GERD (gastroesophageal reflux disease)    Hyperlipidemia    Hypertension    Prostate cancer [facility])     Surgical History: Past Surgical History:  Procedure Laterality Date   COLONOSCOPY     GOLD SEED IMPLANT N/A 05/24/2021   Procedure: GOLD SEED IMPLANT;  Surgeon: Marco Severs, MD;  Location: [facility];  Service: Urology;  Laterality: N/A;   SPACE OAR INSTILLATION N/A 05/24/2021   Procedure: SPACE OAR INSTILLATION;  Surgeon: Marco Severs, MD;  Location: [facility];  Service: Urology;  Laterality: N/A;    Home Medications:  Allergies as of 06/02/2023   No Known Allergies      Medication List        Accurate as of June 02, 2023 10:34 AM. If you have any questions, ask your nurse or doctor.          acetaminophen  500 MG tablet Commonly known as: TYLENOL  Take 2 tablets (1,000 mg total) by mouth 3 (three) times daily.   cetirizine 10 MG tablet Commonly known as: ZYRTEC Take 10 mg by mouth daily.   famotidine  40 MG tablet Commonly known as: PEPCID  TAKE 1 TABLET DAILY   fexofenadine -pseudoephedrine 60-120 MG 12 hr tablet Commonly known as: ALLEGRA-D Take 1 tablet by mouth daily as needed (allergies/congestion).   fluticasone  50 MCG/ACT nasal spray Commonly known as: FLONASE  Place 2 sprays into both nostrils 2 (two) times daily.   glucose-Vitamin C 4-0.006 GM Chew chewable tablet Chew 1 tablet by mouth.   lisinopril  20 MG  tablet Commonly known as: ZESTRIL  Take 1 tablet (20 mg total) by mouth daily.   metoprolol  succinate 50 MG 24 hr tablet Commonly known as: TOPROL -XL Take 1 tablet (50 mg total) by mouth daily. Take with or immediately following a meal.   rosuvastatin  20 MG tablet Commonly known as: Crestor  Take 1 tablet (20 mg total) by mouth daily. for cholesterol   sildenafil  20 MG tablet Commonly known as: REVATIO  TAKE 2-5 PILLS AT ONCE BY MOUTH WITH EACH SEXUAL ENCOUNTER   Turmeric 500 MG Caps Take 500 mg by mouth daily.   Vitamin D  125 MCG (5000 UT) Caps Take 5,000 Units by mouth daily.   zinc gluconate 50 MG tablet Take 50 mg by mouth daily.        Allergies: No Known Allergies  Family History: Family History  Problem Relation Age of Onset   Cancer Mother    Hypertension Mother    Arthritis Father    Diabetes Father    Hyperlipidemia Father    Hypertension Father    Stroke Father    Heart disease Maternal Grandfather    Heart disease Paternal Grandfather     Social History:  reports that he has never smoked. He has never used smokeless tobacco. He reports current alcohol use of about 4.0 standard drinks of alcohol per week. He reports that he does not use drugs.  ROS: All  other review of systems were reviewed and are negative except what is noted above in HPI  Physical Exam: BP 128/72   Pulse 71   Constitutional:  Alert and oriented, No acute distress. HEENT: [place] AT, moist mucus membranes.  Trachea midline, no masses. Cardiovascular: No clubbing, cyanosis, or edema. Respiratory: Normal respiratory effort, no increased work of breathing. GI: Abdomen is soft, nontender, nondistended, no abdominal masses GU: No CVA tenderness.  Lymph: No cervical or inguinal lymphadenopathy. Skin: No rashes, bruises or suspicious lesions. Neurologic: Grossly intact, no focal deficits, moving all 4 extremities. Psychiatric: Normal mood and affect.  Laboratory Data: Lab Results   Component Value Date   WBC 6.4 05/27/2023   HGB 15.6 05/27/2023   HCT 46.1 05/27/2023   MCV 88 05/27/2023   PLT 200 05/27/2023    Lab Results  Component Value Date   CREATININE 0.97 05/27/2023    No results found for: "PSA"  Lab Results  Component Value Date   TESTOSTERONE  128 (L) 11/21/2021    No results found for: "HGBA1C"  Urinalysis    Component Value Date/Time   APPEARANCEUR Clear 05/27/2023 0900   GLUCOSEU Negative 05/27/2023 0900   BILIRUBINUR Negative 05/27/2023 0900   PROTEINUR Negative 05/27/2023 0900   NITRITE Negative 05/27/2023 0900   LEUKOCYTESUR Negative 05/27/2023 0900    Lab Results  Component Value Date   LABMICR Comment 12/02/2022   WBCUA None seen 04/04/2021   LABEPIT None seen 04/04/2021   MUCUS Present 04/04/2021   BACTERIA None seen 04/04/2021    Pertinent Imaging:  No results found for this or any previous visit.  No results found for this or any previous visit.  No results found for this or any previous visit.  No results found for this or any previous visit.  No results found for this or any previous visit.  No results found for this or any previous visit.  No results found for this or any previous visit.  No results found for this or any previous visit.   Assessment & Plan:    1. Prostate cancer (HCC) (Primary) Followup 6 months with a PSA - Urinalysis, Routine w reflex microscopic  2. Erectile dysfunction due to arterial insufficiency -we will trial tadalafil 20mg  prn   No follow-ups on file.  Johnie Nailer, MD  [facility] [place]

## 2023-06-05 ENCOUNTER — Encounter: Payer: Self-pay | Admitting: *Deleted

## 2023-07-10 ENCOUNTER — Other Ambulatory Visit: Payer: Self-pay | Admitting: Family Medicine

## 2023-07-10 ENCOUNTER — Telehealth: Payer: Self-pay | Admitting: Family Medicine

## 2023-07-10 DIAGNOSIS — E782 Mixed hyperlipidemia: Secondary | ICD-10-CM

## 2023-07-10 DIAGNOSIS — I10 Essential (primary) hypertension: Secondary | ICD-10-CM

## 2023-07-10 NOTE — Telephone Encounter (Signed)
 Orders placed.

## 2023-07-29 DIAGNOSIS — M2022 Hallux rigidus, left foot: Secondary | ICD-10-CM | POA: Diagnosis not present

## 2023-07-29 DIAGNOSIS — M79672 Pain in left foot: Secondary | ICD-10-CM | POA: Diagnosis not present

## 2023-08-25 ENCOUNTER — Encounter: Payer: Self-pay | Admitting: Family Medicine

## 2023-08-25 ENCOUNTER — Ambulatory Visit: Payer: Self-pay | Admitting: Family Medicine

## 2023-08-25 ENCOUNTER — Ambulatory Visit (INDEPENDENT_AMBULATORY_CARE_PROVIDER_SITE_OTHER): Admitting: Family Medicine

## 2023-08-25 VITALS — BP 108/61 | HR 57 | Temp 97.7°F | Ht 70.5 in | Wt 197.0 lb

## 2023-08-25 DIAGNOSIS — Z0000 Encounter for general adult medical examination without abnormal findings: Secondary | ICD-10-CM

## 2023-08-25 MED ORDER — SILDENAFIL CITRATE 100 MG PO TABS: 50.0000 mg | ORAL_TABLET | Freq: Every day | ORAL | 11 refills | Status: AC | PRN

## 2023-08-25 MED ORDER — CELECOXIB 200 MG PO CAPS
200.0000 mg | ORAL_CAPSULE | Freq: Every day | ORAL | 5 refills | Status: DC
Start: 2023-08-25 — End: 2023-11-26

## 2023-08-25 NOTE — Progress Notes (Signed)
 Subjective:   Edward Taylor is a 68 y.o. male who presents for an Initial Medicare Annual Wellness Visit.  Social History: Born/Raised: [place], [place]/ 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 Vodka and Cranberry juice 1-2 daily. Occasional beer or wine    Review of Systems  Review of Systems  Constitutional:  Negative for activity change, fatigue and unexpected weight change.  HENT:  Negative for congestion, ear pain, hearing loss, postnasal drip and trouble swallowing.   Eyes:  Negative for pain and visual disturbance.  Respiratory:  Negative for cough, chest tightness and shortness of breath.   Cardiovascular:  Negative for chest pain, palpitations and leg swelling.  Gastrointestinal:  Negative for abdominal distention, abdominal pain, blood in stool, constipation, diarrhea, nausea and vomiting.  Endocrine: Negative for cold intolerance, heat intolerance and polydipsia.  Genitourinary:  Negative for difficulty urinating, dysuria, flank pain, frequency and urgency.  Musculoskeletal:  Positive for back pain (left lumbar to buttock area. Grabs with hip flexion.). Negative for arthralgias and joint swelling.  Skin:  Negative for color change, rash and wound.  Neurological:  Negative for dizziness, syncope, speech difficulty, weakness, light-headedness, numbness and headaches.  Hematological:  Does not bruise/bleed easily.  Psychiatric/Behavioral:  Negative for confusion, decreased concentration, dysphoric mood and sleep disturbance. The patient is not nervous/anxious.      Current Medications (verified) Outpatient Encounter Medications as of 08/25/2023  Medication Sig   acetaminophen  (TYLENOL ) 500 MG tablet Take 2 tablets (1,000 mg total) by mouth 3 (three) times daily.   celecoxib  (CELEBREX ) 200 MG capsule Take 1 capsule (200 mg total) by mouth daily. With  food   Cholecalciferol (VITAMIN D ) 125 MCG (5000 UT) CAPS Take 5,000 Units by mouth daily.   famotidine  (PEPCID ) 40 MG tablet TAKE 1 TABLET DAILY   fexofenadine -pseudoephedrine (ALLEGRA-D) 60-120 MG 12 hr tablet Take 1 tablet by mouth daily as needed (allergies/congestion).   fluticasone  (FLONASE ) 50 MCG/ACT nasal spray Place 2 sprays into both nostrils 2 (two) times daily.   glucose-Vitamin C 4-0.006 GM CHEW chewable tablet Chew 1 tablet by mouth.   lisinopril  (ZESTRIL ) 20 MG tablet Take 1 tablet (20 mg total) by mouth daily.   metoprolol  succinate (TOPROL -XL) 50 MG 24 hr tablet Take 1 tablet (50 mg total) by mouth daily. Take with or immediately following a meal.   rosuvastatin  (CRESTOR ) 20 MG tablet Take 1 tablet (20 mg total) by mouth daily. for cholesterol   sildenafil  (REVATIO ) 20 MG tablet TAKE 2-5 PILLS AT ONCE BY MOUTH WITH EACH SEXUAL ENCOUNTER   sildenafil  (VIAGRA ) 100 MG tablet Take 0.5-1 tablets (50-100 mg total) by mouth daily as needed for erectile dysfunction (sex).   Turmeric 500 MG CAPS Take 500 mg by mouth daily.   zinc gluconate 50 MG tablet Take 50 mg by mouth daily.   [DISCONTINUED] cetirizine (ZYRTEC) 10 MG tablet Take 10 mg by mouth daily.   [DISCONTINUED] tadalafil  (CIALIS ) 20 MG tablet Take 1 tablet (20 mg total) by mouth daily as needed.   No facility-administered encounter medications on file as of 08/25/2023.    Allergies (verified) Patient has no known allergies.   History: Past Medical History:  Diagnosis Date   Allergy    Basal cell carcinoma    GERD (gastroesophageal reflux disease)    Hyperlipidemia    Hypertension    Prostate cancer [facility])    Past Surgical History:  Procedure Laterality Date  COLONOSCOPY     GOLD SEED IMPLANT N/A 05/24/2021   Procedure: GOLD SEED IMPLANT;  Surgeon: Sherrilee Belvie CROME, MD;  Location: [facility];  Service: Urology;  Laterality: N/A;   SPACE OAR INSTILLATION N/A 05/24/2021   Procedure: SPACE OAR INSTILLATION;  Surgeon:  Sherrilee Belvie CROME, MD;  Location: [facility];  Service: Urology;  Laterality: N/A;   Family History  Problem Relation Age of Onset   Cancer Mother    Hypertension Mother    Arthritis Father    Diabetes Father    Hyperlipidemia Father    Hypertension Father    Stroke Father    Heart disease Maternal Grandfather    Heart disease Paternal Grandfather    Social History   Occupational History   Occupation: Gen manager/COO  Tobacco Use   Smoking status: Never   Smokeless tobacco: Never  Vaping Use   Vaping status: Never Used  Substance and Sexual Activity   Alcohol use: Yes    Alcohol/week: 4.0 standard drinks of alcohol    Types: 4 Glasses of wine per week   Drug use: No   Sexual activity: Yes    Birth control/protection: None    Do you feel safe at home?  Yes Are there smokers in your home (other than you)? No  Dietary issues and exercise activities: Walks 10 miles 5 days a week.      Objective:    Today's Vitals   08/25/23 0954  BP: 108/61  Pulse: (!) 57  Temp: 97.7 F (36.5 C)  SpO2: 97%  Weight: 197 lb (89.4 kg)  Height: 5' 10.5 (1.791 m)   Body mass index is 27.87 kg/m.  Activities of Daily Living    08/21/2023   10:43 AM  In your present state of health, do you have any difficulty performing the following activities:  Hearing? 0  Vision? 0  Difficulty concentrating or making decisions? 0  Walking or climbing stairs? 0  Dressing or bathing? 0  Doing errands, shopping? 0  Preparing Food and eating ? N  Using the Toilet? N  In the past six months, have you accidently leaked urine? N  Do you have problems with loss of bowel control? N  Managing your Medications? N  Managing your Finances? N  Housekeeping or managing your Housekeeping? N        Depression Screen    08/25/2023   10:24 AM 05/27/2023    7:58 AM 11/25/2022    7:57 AM 11/25/2022    7:49 AM  PHQ 2/9 Scores  PHQ - 2 Score 0 2 1 0  PHQ- 9 Score 2 2 4     Caregiver for bipolar  wife  Fall Risk    08/21/2023   10:43 AM 11/25/2022    7:49 AM 05/22/2022    7:53 AM 11/21/2021    8:01 AM 05/21/2021    8:03 AM  Fall Risk   Falls in the past year? 0 0 0 0 0  Number falls in past yr: 0      Injury with Fall? 0        Cognitive Function:    08/25/2023   10:16 AM  MMSE - Mini Mental State Exam  Orientation to time 5  Orientation to Place 5  Registration 3  Attention/ Calculation 5  Recall 3  Language- name 2 objects 2  Language- repeat 1  Language- follow 3 step command 3  Language- read & follow direction 1  Write a sentence 1  Copy  design 1  Total score 30    Immunizations and Health Maintenance Immunization History  Administered Date(s) Administered   Influenza,inj,Quad PF,6+ Mos 10/31/2018   Influenza-Unspecified 11/10/2017, 10/26/2018   Tdap 04/08/2017   There are no preventive care reminders to display for this patient.   Patient Care Team: Zollie Lowers, MD as PCP - General (Family Medicine) Vertell Pont, RN Patrcia Cough, MD as Consulting Physician (Radiation Oncology) Crawford Morna Pickle, NP as Nurse Practitioner (Hematology and Oncology) Sherrilee Belvie CROME, MD as Consulting Physician (Urology)  Indicate any recent Medical Services you may have received from other than [facility] providers in the past year (date may be approximate).    Assessment:    Annual Wellness Visit    Screening Tests Health Maintenance  Topic Date Due   Zoster Vaccines- Shingrix (1 of 2) 08/26/2023 (Originally 01/10/1975)   COVID-19 Vaccine (1) 09/10/2023 (Originally 01/11/1961)   Pneumococcal Vaccine: 50+ Years (1 of 2 - PCV) 05/26/2024 (Originally 01/10/1975)   INFLUENZA VACCINE  09/05/2023   Medicare Annual Wellness (AWV)  08/24/2024   Colonoscopy  05/07/2025   DTaP/Tdap/Td (2 - Td or Tdap) 04/09/2027   Hepatitis C Screening  Completed   HIV Screening  Completed   Hepatitis B Vaccines  Aged Out   HPV VACCINES  Aged Out   Meningococcal B Vaccine   Aged Out        Plan:   During the course of the visit Umar was educated and counseled about the following appropriate screening and preventive services:  Vaccines to include Pneumoccal, Influenza, Td and Shingles Colorectal cancer screening Cardiovascular disease screening Diabetes screening Bone Denisty / Osteoporosis Screening Mammogram PAP Glaucoma screening / Diabetic Eye Exam Nutrition counseling Smoking cessation counseling Advanced Directives Physical Activity    Goals   None     1. Welcome to Medicare preventive visit     Meds ordered this encounter  Medications   sildenafil  (VIAGRA ) 100 MG tablet    Sig: Take 0.5-1 tablets (50-100 mg total) by mouth daily as needed for erectile dysfunction (sex).    Dispense:  10 tablet    Refill:  11   celecoxib  (CELEBREX ) 200 MG capsule    Sig: Take 1 capsule (200 mg total) by mouth daily. With food    Dispense:  30 capsule    Refill:  5    Orders Placed This Encounter  Procedures   EKG 12-Lead    Follow up 3 months  Lowers Zollie, MD   Patient Instructions (the written plan) were given to the patient.   Lowers Zollie, MD   08/25/2023

## 2023-09-23 DIAGNOSIS — M79672 Pain in left foot: Secondary | ICD-10-CM | POA: Diagnosis not present

## 2023-09-23 DIAGNOSIS — M722 Plantar fascial fibromatosis: Secondary | ICD-10-CM | POA: Diagnosis not present

## 2023-11-04 DIAGNOSIS — M79672 Pain in left foot: Secondary | ICD-10-CM | POA: Diagnosis not present

## 2023-11-04 DIAGNOSIS — M722 Plantar fascial fibromatosis: Secondary | ICD-10-CM | POA: Diagnosis not present

## 2023-11-24 ENCOUNTER — Other Ambulatory Visit

## 2023-11-24 DIAGNOSIS — E782 Mixed hyperlipidemia: Secondary | ICD-10-CM | POA: Diagnosis not present

## 2023-11-24 DIAGNOSIS — C61 Malignant neoplasm of prostate: Secondary | ICD-10-CM

## 2023-11-24 DIAGNOSIS — I10 Essential (primary) hypertension: Secondary | ICD-10-CM

## 2023-11-24 LAB — LIPID PANEL
Chol/HDL Ratio: 2.9 ratio (ref 0.0–5.0)
Cholesterol, Total: 140 mg/dL (ref 100–199)
HDL: 48 mg/dL (ref >39)
LDL Chol Calc (NIH): 74 mg/dL (ref 0–99)
Triglycerides: 96 mg/dL (ref 0–149)
VLDL Cholesterol Cal: 18 mg/dL (ref 5–40)

## 2023-11-24 LAB — CBC WITH DIFFERENTIAL/PLATELET
Basophils Absolute: 0.1 x10E3/uL (ref 0.0–0.2)
Basos: 1 %
EOS (ABSOLUTE): 0.1 x10E3/uL (ref 0.0–0.4)
Eos: 2 %
Hematocrit: 45.9 % (ref 37.5–51.0)
Hemoglobin: 15.1 g/dL (ref 13.0–17.7)
Immature Grans (Abs): 0.2 x10E3/uL — ABNORMAL HIGH (ref 0.0–0.1)
Immature Granulocytes: 2 %
Lymphocytes Absolute: 1.6 x10E3/uL (ref 0.7–3.1)
Lymphs: 21 %
MCH: 30.8 pg (ref 26.6–33.0)
MCHC: 32.9 g/dL (ref 31.5–35.7)
MCV: 94 fL (ref 79–97)
Monocytes Absolute: 0.6 x10E3/uL (ref 0.1–0.9)
Monocytes: 8 %
Neutrophils Absolute: 4.9 x10E3/uL (ref 1.4–7.0)
Neutrophils: 66 %
Platelets: 200 x10E3/uL (ref 150–450)
RBC: 4.91 x10E6/uL (ref 4.14–5.80)
RDW: 12.2 % (ref 11.6–15.4)
WBC: 7.3 x10E3/uL (ref 3.4–10.8)

## 2023-11-24 LAB — CMP14+EGFR
ALT: 25 IU/L (ref 0–44)
AST: 21 IU/L (ref 0–40)
Albumin: 4.8 g/dL (ref 3.9–4.9)
Alkaline Phosphatase: 55 IU/L (ref 47–123)
BUN/Creatinine Ratio: 20 (ref 10–24)
BUN: 20 mg/dL (ref 8–27)
Bilirubin Total: 0.4 mg/dL (ref 0.0–1.2)
CO2: 23 mmol/L (ref 20–29)
Calcium: 9.5 mg/dL (ref 8.6–10.2)
Chloride: 101 mmol/L (ref 96–106)
Creatinine, Ser: 0.99 mg/dL (ref 0.76–1.27)
Globulin, Total: 2.2 g/dL (ref 1.5–4.5)
Glucose: 109 mg/dL — ABNORMAL HIGH (ref 70–99)
Potassium: 4.5 mmol/L (ref 3.5–5.2)
Sodium: 139 mmol/L (ref 134–144)
Total Protein: 7 g/dL (ref 6.0–8.5)
eGFR: 85 mL/min/1.73 (ref >59)

## 2023-11-25 ENCOUNTER — Ambulatory Visit: Payer: Self-pay | Admitting: Urology

## 2023-11-25 ENCOUNTER — Ambulatory Visit: Payer: Self-pay | Admitting: Family Medicine

## 2023-11-25 DIAGNOSIS — H5203 Hypermetropia, bilateral: Secondary | ICD-10-CM | POA: Diagnosis not present

## 2023-11-25 LAB — PSA: Prostate Specific Ag, Serum: <0.1 ng/mL (ref 0.0–4.0)

## 2023-11-25 NOTE — Progress Notes (Signed)
 Hello Freeland,  Your lab result is normal and/or stable.Some minor variations that are not significant are commonly marked abnormal, but do not represent any medical problem for you.  Best regards, Mechele Claude, M.D.

## 2023-11-26 ENCOUNTER — Ambulatory Visit: Payer: Self-pay | Admitting: Family Medicine

## 2023-11-26 ENCOUNTER — Encounter: Payer: Self-pay | Admitting: Family Medicine

## 2023-11-26 ENCOUNTER — Ambulatory Visit (INDEPENDENT_AMBULATORY_CARE_PROVIDER_SITE_OTHER): Admitting: Family Medicine

## 2023-11-26 DIAGNOSIS — I10 Essential (primary) hypertension: Secondary | ICD-10-CM | POA: Diagnosis not present

## 2023-11-26 DIAGNOSIS — Z23 Encounter for immunization: Secondary | ICD-10-CM

## 2023-11-26 DIAGNOSIS — N529 Male erectile dysfunction, unspecified: Secondary | ICD-10-CM

## 2023-11-26 DIAGNOSIS — K5790 Diverticulosis of intestine, part unspecified, without perforation or abscess without bleeding: Secondary | ICD-10-CM | POA: Diagnosis not present

## 2023-11-26 DIAGNOSIS — M79673 Pain in unspecified foot: Secondary | ICD-10-CM

## 2023-11-26 DIAGNOSIS — E782 Mixed hyperlipidemia: Secondary | ICD-10-CM

## 2023-11-26 DIAGNOSIS — C61 Malignant neoplasm of prostate: Secondary | ICD-10-CM

## 2023-11-26 MED ORDER — DICLOFENAC SODIUM 75 MG PO TBEC: 75.0000 mg | DELAYED_RELEASE_TABLET | Freq: Two times a day (BID) | ORAL | 1 refills | Status: AC

## 2023-11-26 NOTE — Addendum Note (Signed)
 Addended by: MILAS KENT D on: 11/26/2023 10:44 AM   Modules accepted: Orders

## 2023-11-26 NOTE — Progress Notes (Signed)
 Subjective:  Patient ID: Edward Taylor, male    DOB: 10-30-1955  Age: 68 y.o. MRN: [id number]  CC: Medical Management of Chronic Issues (6 month follow up)   HPI  Discussed the use of AI scribe software for clinical note transcription with the patient, who gave verbal consent to proceed.  History of Present Illness Edward Taylor is a 68 year old male who presents for a shingles vaccine and follow-up on his PSA levels.  He is concerned about the potential severity of shingles and postherpetic neuralgia, influenced by knowing someone who suffers from severe pain due to shingles.  He reports that his PSA remains undetectable, and that he is now 26 months after the end of androgen deprivation therapy. He is scheduled for a follow-up next Monday.  Recent blood work shows a cholesterol level of 140 with a favorable HDL to LDL ratio of 2.9. His blood sugar level was 109, which is in the gray zone if fasting, but acceptable if not fasting. He inquires about the potential impact of rosuvastatin  on blood sugar levels, noting that he has been taking it to manage his cholesterol.  He discusses a heel injury likely related to a bone spur and osteoarthritis in a large joint, which limits his walking to five miles in the morning. He has stopped taking Celebrex  due to constipation and is awaiting personalized orthotics to help with the pain. He previously used Voltaren cream for joint pain, which was effective for the joint but not the heel.  He uses sildenafil  (Viagra ) as needed, with a prescription for 100 mg tablets that he sometimes splits in half. He obtains this medication at a reduced cost through a membership at [employer].  He has a history of elevated liver enzymes with diclofenac use 15-20 years ago, which led to discontinuation of the medication at that time. He is open to retrying diclofenac with monitoring of liver enzymes.          11/26/2023    9:00 AM 08/25/2023   10:24 AM  05/27/2023    7:58 AM  Depression screen PHQ 2/9  Decreased Interest 0 0 1  Down, Depressed, Hopeless 1 0 1  PHQ - 2 Score 1 0 2  Altered sleeping 1 1 0  Tired, decreased energy 1 1 0  Change in appetite 0 0 0  Feeling bad or failure about yourself  0 0 0  Trouble concentrating 0 0 0  Moving slowly or fidgety/restless 0 0 0  Suicidal thoughts 0 0 0  PHQ-9 Score 3 2 2   Difficult doing work/chores Not difficult at all Not difficult at all Not difficult at all    History Edward Taylor has a past medical history of Allergy, Basal cell carcinoma, Cataract (2022), GERD (gastroesophageal reflux disease), Hyperlipidemia, Hypertension, and Prostate cancer (HCC).   He has a past surgical history that includes Colonoscopy; Gold seed implant (N/A, 05/24/2021); and SPACE OAR INSTILLATION (N/A, 05/24/2021).   His family history includes Arthritis in his father; Cancer in his mother; Diabetes in his father; Heart disease in his maternal grandfather and paternal grandfather; Hyperlipidemia in his father; Hypertension in his father and mother; Stroke in his father and paternal grandmother.He reports that he has never smoked. He has never used smokeless tobacco. He reports current alcohol use of about 4.0 standard drinks of alcohol per week. He reports that he does not use drugs.    ROS Review of Systems  Constitutional: Negative.   HENT: Negative.    Eyes:  Negative for visual disturbance.  Respiratory:  Negative for cough and shortness of breath.   Cardiovascular:  Negative for chest pain and leg swelling.  Gastrointestinal:  Negative for abdominal pain, diarrhea, nausea and vomiting.  Genitourinary:  Negative for difficulty urinating.  Musculoskeletal:  Negative for arthralgias and myalgias.  Skin:  Negative for rash.  Neurological:  Negative for headaches.  Psychiatric/Behavioral:  Negative for sleep disturbance.     Objective:  BP 117/68   Pulse 67   Temp 98.6 F (37 C) (Oral)   Ht 5' 10.5  (1.791 m)   Wt 200 lb 9.6 oz (91 kg)   SpO2 97%   BMI 28.38 kg/m   BP Readings from Last 3 Encounters:  11/26/23 117/68  08/25/23 108/61  06/02/23 128/72    Wt Readings from Last 3 Encounters:  11/26/23 200 lb 9.6 oz (91 kg)  08/25/23 197 lb (89.4 kg)  05/27/23 200 lb (90.7 kg)     Physical Exam Physical Exam GENERAL: Alert, cooperative, well developed, no acute distress. HEENT: Normocephalic, normal oropharynx, moist mucous membranes. CHEST: Clear to auscultation bilaterally, no wheezes, rhonchi, or crackles. CARDIOVASCULAR: Normal heart rate and rhythm, S1 and S2 normal without murmurs. ABDOMEN: Soft, non-tender, non-distended, without organomegaly, normal bowel sounds. EXTREMITIES: No cyanosis or edema. NEUROLOGICAL: Cranial nerves grossly intact, moves all extremities without gross motor or sensory deficit.  Results for orders placed or performed in visit on 11/24/23  PSA   Collection Time: 11/24/23 10:16 AM  Result Value Ref Range   Prostate Specific Ag, Serum <0.1 0.0 - 4.0 ng/mL  Lipid panel   Collection Time: 11/24/23 10:25 AM  Result Value Ref Range   Cholesterol, Total 140 100 - 199 mg/dL   Triglycerides 96 0 - 149 mg/dL   HDL 48 >60 mg/dL   VLDL Cholesterol Cal 18 5 - 40 mg/dL   LDL Chol Calc (NIH) 74 0 - 99 mg/dL   Chol/HDL Ratio 2.9 0.0 - 5.0 ratio  CMP14+EGFR   Collection Time: 11/24/23 10:25 AM  Result Value Ref Range   Glucose 109 (H) 70 - 99 mg/dL   BUN 20 8 - 27 mg/dL   Creatinine, Ser 9.00 0.76 - 1.27 mg/dL   eGFR 85 >40 fO/fpw/8.26   BUN/Creatinine Ratio 20 10 - 24   Sodium 139 134 - 144 mmol/L   Potassium 4.5 3.5 - 5.2 mmol/L   Chloride 101 96 - 106 mmol/L   CO2 23 20 - 29 mmol/L   Calcium  9.5 8.6 - 10.2 mg/dL   Total Protein 7.0 6.0 - 8.5 g/dL   Albumin 4.8 3.9 - 4.9 g/dL   Globulin, Total 2.2 1.5 - 4.5 g/dL   Bilirubin Total 0.4 0.0 - 1.2 mg/dL   Alkaline Phosphatase 55 47 - 123 IU/L   AST 21 0 - 40 IU/L   ALT 25 0 - 44 IU/L  CBC  with Differential/Platelet   Collection Time: 11/24/23 10:25 AM  Result Value Ref Range   WBC 7.3 3.4 - 10.8 x10E3/uL   RBC 4.91 4.14 - 5.80 x10E6/uL   Hemoglobin 15.1 13.0 - 17.7 g/dL   Hematocrit 54.0 62.4 - 51.0 %   MCV 94 79 - 97 fL   MCH 30.8 26.6 - 33.0 pg   MCHC 32.9 31.5 - 35.7 g/dL   RDW 87.7 88.3 - 84.5 %   Platelets 200 150 - 450 x10E3/uL   Neutrophils 66 Not Estab. %   Lymphs 21 Not Estab. %   Monocytes 8  Not Estab. %   Eos 2 Not Estab. %   Basos 1 Not Estab. %   Neutrophils Absolute 4.9 1.4 - 7.0 x10E3/uL   Lymphocytes Absolute 1.6 0.7 - 3.1 x10E3/uL   Monocytes Absolute 0.6 0.1 - 0.9 x10E3/uL   EOS (ABSOLUTE) 0.1 0.0 - 0.4 x10E3/uL   Basophils Absolute 0.1 0.0 - 0.2 x10E3/uL   Immature Granulocytes 2 Not Estab. %   Immature Grans (Abs) 0.2 (H) 0.0 - 0.1 x10E3/uL    Assessment & Plan:  Mixed hyperlipidemia  Primary hypertension -     CMP14+EGFR; Future  Diverticulosis  Malignant neoplasm of prostate (HCC)  Erectile dysfunction, unspecified erectile dysfunction type  Pain of foot, unspecified laterality -     CMP14+EGFR; Future  Other orders -     Diclofenac Sodium; Take 1 tablet (75 mg total) by mouth 2 (two) times daily. For muscle and  Joint pain  Dispense: 180 tablet; Refill: 1    Assessment and Plan Assessment & Plan Foot pain due to right heel spur and osteoarthritis   Chronic foot pain is attributed to a heel spur and osteoarthritis in the right foot, worsened by walking over three miles. Celebrex  was ineffective and caused constipation, so it will be discontinued. Diclofenac is prescribed for pain management. A liver enzyme test is scheduled in six weeks to monitor potential liver issues from diclofenac. If orthotics and diclofenac are ineffective, a referral to a foot specialist in orthopedics will be considered.  Malignant neoplasm of prostate, in remission   Prostate cancer remains in remission with undetectable PSA levels for 26 months  post-ADT, a positive indicator. PSA levels will continue to be monitored. A follow-up with oncologist Dr. Sherrilee is scheduled for next Monday.  Mixed hyperlipidemia   Cholesterol levels are well-controlled with rosuvastatin . Total cholesterol is 140 with a favorable HDL to LDL ratio of 2.9, indicating a 50% reduction in cardiovascular event risk. Rosuvastatin  therapy will continue.  Male erectile dysfunction   Erectile dysfunction is effectively managed with sildenafil  (Viagra ). Cost-effective options for obtaining sildenafil  through a [employer] were discussed. Sildenafil  will be continued as needed.  General Health Maintenance   The benefits of the shingles vaccination in preventing postherpetic neuralgia and complications in immunocompromised states were discussed, emphasizing the severe pain associated with postherpetic neuralgia and potential widespread shingles in cases of immunosuppression. The shingles vaccine will be administered.       Follow-up: Return in about 6 months (around 05/26/2024) for [facility] physical.  Butler Der, M.D.

## 2023-11-28 MED ORDER — FAMOTIDINE 40 MG PO TABS: 40.0000 mg | ORAL_TABLET | Freq: Every day | ORAL | 0 refills | Status: DC

## 2023-12-01 ENCOUNTER — Ambulatory Visit: Admitting: Urology

## 2023-12-01 ENCOUNTER — Encounter: Payer: Self-pay | Admitting: Urology

## 2023-12-01 VITALS — BP 115/76 | HR 72

## 2023-12-01 DIAGNOSIS — C61 Malignant neoplasm of prostate: Secondary | ICD-10-CM

## 2023-12-01 DIAGNOSIS — L570 Actinic keratosis: Secondary | ICD-10-CM | POA: Diagnosis not present

## 2023-12-01 DIAGNOSIS — N5201 Erectile dysfunction due to arterial insufficiency: Secondary | ICD-10-CM

## 2023-12-01 DIAGNOSIS — L821 Other seborrheic keratosis: Secondary | ICD-10-CM | POA: Diagnosis not present

## 2023-12-01 LAB — URINALYSIS, ROUTINE W REFLEX MICROSCOPIC
Bilirubin, UA: NEGATIVE
Glucose, UA: NEGATIVE
Ketones, UA: NEGATIVE
Leukocytes,UA: NEGATIVE
Nitrite, UA: NEGATIVE
Protein,UA: NEGATIVE
RBC, UA: NEGATIVE
Specific Gravity, UA: 1.025 (ref 1.005–1.030)
Urobilinogen, Ur: 0.2 mg/dL (ref 0.2–1.0)
pH, UA: 5.5 (ref 5.0–7.5)

## 2023-12-01 NOTE — Progress Notes (Signed)
 12/01/2023 9:14 AM   Edward Taylor 12-09-1955 [id number]  Referring provider: Sherrilee Belvie CROME, MD [address]  [address] [place],  [place] [address]  [place] prostate cancer   HPI: Edward Taylor is a 67yo here for followup for prostate cancer and erectile dysfunction. PSA remains undetectable off of ADT. He takes tadalafil  and sildenafil  prn with mixed results.    PMH: Past Medical History:  Diagnosis Date   Allergy    Basal cell carcinoma    Cataract 2022   very mild   GERD (gastroesophageal reflux disease)    Hyperlipidemia    Hypertension    Prostate cancer [facility])     Surgical History: Past Surgical History:  Procedure Laterality Date   COLONOSCOPY     GOLD SEED IMPLANT N/A 05/24/2021   Procedure: GOLD SEED IMPLANT;  Surgeon: Sherrilee Belvie CROME, MD;  Location: [facility];  Service: Urology;  Laterality: N/A;   SPACE OAR INSTILLATION N/A 05/24/2021   Procedure: SPACE OAR INSTILLATION;  Surgeon: Sherrilee Belvie CROME, MD;  Location: [facility];  Service: Urology;  Laterality: N/A;    Home Medications:  Allergies as of 12/01/2023   No Known Allergies      Medication List        Accurate as of December 01, 2023  9:14 AM. If you have any questions, ask your nurse or doctor.          acetaminophen  500 MG tablet Commonly known as: TYLENOL  Take 2 tablets (1,000 mg total) by mouth 3 (three) times daily.   diclofenac 75 MG EC tablet Commonly known as: VOLTAREN Take 1 tablet (75 mg total) by mouth 2 (two) times daily. For muscle and  Joint pain   famotidine  40 MG tablet Commonly known as: PEPCID  Take 1 tablet (40 mg total) by mouth daily.   fexofenadine -pseudoephedrine 60-120 MG 12 hr tablet Commonly known as: ALLEGRA-D Take 1 tablet by mouth daily as needed (allergies/congestion).   fluticasone  50 MCG/ACT nasal spray Commonly known as: FLONASE  Place 2 sprays into both nostrils 2 (two) times daily.   glucose-Vitamin C 4-0.006 GM Chew chewable  tablet Chew 1 tablet by mouth.   lisinopril  20 MG tablet Commonly known as: ZESTRIL  Take 1 tablet (20 mg total) by mouth daily.   metoprolol  succinate 50 MG 24 hr tablet Commonly known as: TOPROL -XL Take 1 tablet (50 mg total) by mouth daily. Take with or immediately following a meal.   rosuvastatin  20 MG tablet Commonly known as: Crestor  Take 1 tablet (20 mg total) by mouth daily. for cholesterol   sildenafil  100 MG tablet Commonly known as: Viagra  Take 0.5-1 tablets (50-100 mg total) by mouth daily as needed for erectile dysfunction (sex).   tadalafil  20 MG tablet Commonly known as: CIALIS  Take 20 mg by mouth daily as needed.   Turmeric 500 MG Caps Take 500 mg by mouth daily.   Vitamin D  125 MCG (5000 UT) Caps Take 5,000 Units by mouth daily.   zinc gluconate 50 MG tablet Take 50 mg by mouth daily.        Allergies: No Known Allergies  Family History: Family History  Problem Relation Age of Onset   Cancer Mother    Hypertension Mother    Arthritis Father    Diabetes Father    Hyperlipidemia Father    Hypertension Father    Stroke Father    Heart disease Maternal Grandfather    Stroke Paternal Grandmother    Heart disease Paternal Grandfather  Social History:  reports that he has never smoked. He has never used smokeless tobacco. He reports current alcohol use of about 4.0 standard drinks of alcohol per week. He reports that he does not use drugs.  ROS: All other review of systems were reviewed and are negative except what is noted above in HPI  Physical Exam: BP 115/76   Pulse 72   Constitutional:  Alert and oriented, No acute distress. HEENT: [place] AT, moist mucus membranes.  Trachea midline, no masses. Cardiovascular: No clubbing, cyanosis, or edema. Respiratory: Normal respiratory effort, no increased work of breathing. GI: Abdomen is soft, nontender, nondistended, no abdominal masses GU: No CVA tenderness.  Lymph: No cervical or inguinal  lymphadenopathy. Skin: No rashes, bruises or suspicious lesions. Neurologic: Grossly intact, no focal deficits, moving all 4 extremities. Psychiatric: Normal mood and affect.  Laboratory Data: Lab Results  Component Value Date   WBC 7.3 11/24/2023   HGB 15.1 11/24/2023   HCT 45.9 11/24/2023   MCV 94 11/24/2023   PLT 200 11/24/2023    Lab Results  Component Value Date   CREATININE 0.99 11/24/2023    No results found for: PSA  Lab Results  Component Value Date   TESTOSTERONE  128 (L) 11/21/2021    No results found for: HGBA1C  Urinalysis    Component Value Date/Time   APPEARANCEUR Clear 06/02/2023 1017   GLUCOSEU Negative 06/02/2023 1017   BILIRUBINUR Negative 06/02/2023 1017   PROTEINUR Negative 06/02/2023 1017   NITRITE Negative 06/02/2023 1017   LEUKOCYTESUR Negative 06/02/2023 1017    Lab Results  Component Value Date   LABMICR Comment 06/02/2023   WBCUA None seen 04/04/2021   LABEPIT None seen 04/04/2021   MUCUS Present 04/04/2021   BACTERIA None seen 04/04/2021    Pertinent Imaging:  No results found for this or any previous visit.  No results found for this or any previous visit.  No results found for this or any previous visit.  No results found for this or any previous visit.  No results found for this or any previous visit.  No results found for this or any previous visit.  No results found for this or any previous visit.  No results found for this or any previous visit.   Assessment & Plan:    1. Prostate cancer (HCC) (Primary) -followup 6 months with PSA and testosterone  - Urinalysis, Routine w reflex microscopic  2. Erectile dysfunction due to arterial insufficiency Continue tadalafil  and sildenafil  prn   No follow-ups on file.  Belvie Clara, MD  [facility] [place]

## 2023-12-01 NOTE — Patient Instructions (Signed)

## 2023-12-09 ENCOUNTER — Ambulatory Visit: Payer: Self-pay | Admitting: Family Medicine

## 2024-01-01 ENCOUNTER — Other Ambulatory Visit: Payer: Self-pay | Admitting: Family Medicine

## 2024-01-07 ENCOUNTER — Other Ambulatory Visit: Payer: Self-pay

## 2024-01-07 ENCOUNTER — Telehealth: Payer: Self-pay | Admitting: Family Medicine

## 2024-01-07 DIAGNOSIS — M79673 Pain in unspecified foot: Secondary | ICD-10-CM | POA: Diagnosis not present

## 2024-01-07 DIAGNOSIS — I10 Essential (primary) hypertension: Secondary | ICD-10-CM

## 2024-01-07 LAB — CMP14+EGFR
ALT: 26 IU/L (ref 0–44)
AST: 24 IU/L (ref 0–40)
Albumin: 4.8 g/dL (ref 3.9–4.9)
Alkaline Phosphatase: 54 IU/L (ref 47–123)
BUN/Creatinine Ratio: 19 (ref 10–24)
BUN: 21 mg/dL (ref 8–27)
Bilirubin Total: 0.4 mg/dL (ref 0.0–1.2)
CO2: 21 mmol/L (ref 20–29)
Calcium: 9.5 mg/dL (ref 8.6–10.2)
Chloride: 104 mmol/L (ref 96–106)
Creatinine, Ser: 1.1 mg/dL (ref 0.76–1.27)
Globulin, Total: 2.4 g/dL (ref 1.5–4.5)
Glucose: 93 mg/dL (ref 70–99)
Potassium: 4.5 mmol/L (ref 3.5–5.2)
Sodium: 142 mmol/L (ref 134–144)
Total Protein: 7.2 g/dL (ref 6.0–8.5)
eGFR: 74 mL/min/1.73 (ref >59)

## 2024-01-07 NOTE — Telephone Encounter (Signed)
 Please put future lab orders in for April 2026 appt w/[facility] for C[facility].

## 2024-01-08 ENCOUNTER — Ambulatory Visit: Payer: Self-pay | Admitting: Family Medicine

## 2024-01-08 NOTE — Progress Notes (Signed)
 Hello Freeland,  Your lab result is normal and/or stable.Some minor variations that are not significant are commonly marked abnormal, but do not represent any medical problem for you.  Best regards, Mechele Claude, M.D.

## 2024-02-11 ENCOUNTER — Other Ambulatory Visit: Payer: Self-pay | Admitting: Family Medicine

## 2024-05-19 ENCOUNTER — Other Ambulatory Visit

## 2024-05-27 ENCOUNTER — Encounter: Payer: Self-pay | Admitting: Family Medicine

## 2024-06-03 ENCOUNTER — Other Ambulatory Visit

## 2024-06-14 ENCOUNTER — Ambulatory Visit: Admitting: Urology
